# Patient Record
Sex: Female | Born: 1970 | Race: Black or African American | Hispanic: No | Marital: Married | Smoking: Never smoker
Health system: Southern US, Community
[De-identification: ages and names within clinical notes are randomized; demographics above are authoritative.]

## PROBLEM LIST (undated history)

## (undated) DIAGNOSIS — R87629 Unspecified abnormal cytological findings in specimens from vagina: Secondary | ICD-10-CM

## (undated) DIAGNOSIS — I10 Essential (primary) hypertension: Secondary | ICD-10-CM

## (undated) DIAGNOSIS — Z9884 Bariatric surgery status: Secondary | ICD-10-CM

## (undated) DIAGNOSIS — R0902 Hypoxemia: Secondary | ICD-10-CM

## (undated) DIAGNOSIS — F32A Depression, unspecified: Secondary | ICD-10-CM

## (undated) DIAGNOSIS — E079 Disorder of thyroid, unspecified: Secondary | ICD-10-CM

## (undated) DIAGNOSIS — G4730 Sleep apnea, unspecified: Secondary | ICD-10-CM

## (undated) DIAGNOSIS — T7840XA Allergy, unspecified, initial encounter: Secondary | ICD-10-CM

## (undated) DIAGNOSIS — F419 Anxiety disorder, unspecified: Secondary | ICD-10-CM

## (undated) DIAGNOSIS — E039 Hypothyroidism, unspecified: Secondary | ICD-10-CM

## (undated) DIAGNOSIS — C539 Malignant neoplasm of cervix uteri, unspecified: Secondary | ICD-10-CM

## (undated) HISTORY — DX: Sleep apnea, unspecified: G47.30

## (undated) HISTORY — DX: Depression, unspecified: F32.A

## (undated) HISTORY — DX: Anxiety disorder, unspecified: F41.9

## (undated) HISTORY — PX: CARPAL TUNNEL RELEASE: SHX101

## (undated) HISTORY — DX: Allergy, unspecified, initial encounter: T78.40XA

## (undated) HISTORY — DX: Hypoxemia: R09.02

## (undated) HISTORY — PX: ABDOMINAL HYSTERECTOMY: SHX81

## (undated) HISTORY — DX: Unspecified abnormal cytological findings in specimens from vagina: R87.629

---

## 2000-05-18 DIAGNOSIS — C539 Malignant neoplasm of cervix uteri, unspecified: Secondary | ICD-10-CM

## 2000-05-18 HISTORY — DX: Malignant neoplasm of cervix uteri, unspecified: C53.9

## 2013-05-18 DIAGNOSIS — Z9884 Bariatric surgery status: Secondary | ICD-10-CM

## 2013-05-18 DIAGNOSIS — Z6841 Body Mass Index (BMI) 40.0 and over, adult: Secondary | ICD-10-CM | POA: Insufficient documentation

## 2013-05-18 HISTORY — PX: ROUX-EN-Y PROCEDURE: SUR1287

## 2013-05-18 HISTORY — DX: Bariatric surgery status: Z98.84

## 2016-11-24 DIAGNOSIS — R609 Edema, unspecified: Secondary | ICD-10-CM | POA: Insufficient documentation

## 2016-11-24 DIAGNOSIS — M7741 Metatarsalgia, right foot: Secondary | ICD-10-CM | POA: Insufficient documentation

## 2016-11-24 DIAGNOSIS — M8430XA Stress fracture, unspecified site, initial encounter for fracture: Secondary | ICD-10-CM | POA: Insufficient documentation

## 2017-07-19 ENCOUNTER — Encounter (HOSPITAL_COMMUNITY): Payer: Self-pay | Admitting: Emergency Medicine

## 2017-07-19 ENCOUNTER — Other Ambulatory Visit: Payer: Self-pay

## 2017-07-19 ENCOUNTER — Emergency Department (HOSPITAL_COMMUNITY): Payer: Commercial Managed Care - PPO

## 2017-07-19 ENCOUNTER — Emergency Department (HOSPITAL_COMMUNITY)
Admission: EM | Admit: 2017-07-19 | Discharge: 2017-07-19 | Disposition: A | Discharge status: Home / Self Care | Payer: Commercial Managed Care - PPO | Attending: Emergency Medicine | Admitting: Emergency Medicine

## 2017-07-19 DIAGNOSIS — I10 Essential (primary) hypertension: Secondary | ICD-10-CM | POA: Insufficient documentation

## 2017-07-19 DIAGNOSIS — R0789 Other chest pain: Secondary | ICD-10-CM | POA: Diagnosis not present

## 2017-07-19 DIAGNOSIS — R079 Chest pain, unspecified: Secondary | ICD-10-CM | POA: Diagnosis present

## 2017-07-19 HISTORY — DX: Essential (primary) hypertension: I10

## 2017-07-19 HISTORY — DX: Disorder of thyroid, unspecified: E07.9

## 2017-07-19 HISTORY — DX: Malignant neoplasm of cervix uteri, unspecified: C53.9

## 2017-07-19 HISTORY — DX: Bariatric surgery status: Z98.84

## 2017-07-19 LAB — CBC WITH DIFFERENTIAL/PLATELET
Basophils Absolute: 0 10*3/uL (ref 0.0–0.1)
Basophils Relative: 1 %
Eosinophils Absolute: 0.1 10*3/uL (ref 0.0–0.7)
Eosinophils Relative: 2 %
HCT: 39.9 % (ref 36.0–46.0)
Hemoglobin: 12.3 g/dL (ref 12.0–15.0)
Lymphocytes Relative: 35 %
Lymphs Abs: 1.8 10*3/uL (ref 0.7–4.0)
MCH: 26 pg (ref 26.0–34.0)
MCHC: 30.8 g/dL (ref 30.0–36.0)
MCV: 84.4 fL (ref 78.0–100.0)
Monocytes Absolute: 0.5 10*3/uL (ref 0.1–1.0)
Monocytes Relative: 9 %
Neutro Abs: 2.9 10*3/uL (ref 1.7–7.7)
Neutrophils Relative %: 55 %
Platelets: 225 10*3/uL (ref 150–400)
RBC: 4.73 MIL/uL (ref 3.87–5.11)
RDW: 15.5 % (ref 11.5–15.5)
WBC: 5.2 10*3/uL (ref 4.0–10.5)

## 2017-07-19 LAB — COMPREHENSIVE METABOLIC PANEL
ALT: 20 U/L (ref 14–54)
AST: 24 U/L (ref 15–41)
Albumin: 4 g/dL (ref 3.5–5.0)
Alkaline Phosphatase: 60 U/L (ref 38–126)
Anion gap: 10 (ref 5–15)
BUN: 16 mg/dL (ref 6–20)
CO2: 29 mmol/L (ref 22–32)
Calcium: 9.6 mg/dL (ref 8.9–10.3)
Chloride: 100 mmol/L — ABNORMAL LOW (ref 101–111)
Creatinine, Ser: 0.78 mg/dL (ref 0.44–1.00)
GFR calc Af Amer: >60 mL/min (ref >60)
GFR calc non Af Amer: >60 mL/min (ref >60)
Glucose, Bld: 92 mg/dL (ref 65–99)
Potassium: 3.3 mmol/L — ABNORMAL LOW (ref 3.5–5.1)
Sodium: 139 mmol/L (ref 135–145)
Total Bilirubin: 0.1 mg/dL — ABNORMAL LOW (ref 0.3–1.2)
Total Protein: 7.2 g/dL (ref 6.5–8.1)

## 2017-07-19 LAB — TROPONIN I: Troponin I: <0.03 ng/mL (ref <0.03)

## 2017-07-19 MED ORDER — IBUPROFEN 800 MG PO TABS: 800.0000 mg | ORAL_TABLET | Freq: Three times a day (TID) | ORAL | 0 refills | Status: DC | PRN

## 2017-07-19 NOTE — ED Triage Notes (Signed)
PT c/o middle chest tightness intermittently that started this am with SOB on exertion. PT denies any pain at this time.

## 2017-07-19 NOTE — Discharge Instructions (Signed)
Follow-up with your family doctor next week if not improving.  No heavy lifting

## 2017-07-19 NOTE — ED Notes (Signed)
ekg handed to Dr. Butler 

## 2017-07-19 NOTE — ED Provider Notes (Signed)
[facility] Provider Note   CSN: [id number] Arrival date & time: 07/19/17  1416     History   Chief Complaint Chief Complaint  Patient presents with  . Chest Pain    HPI Jacqueline Parsons is a 47 y.o. female.  Patient states that she has been having some upper chest pain is worse with moving her neck.  She states that she recently did a lot of lifting because she was moving   The history is provided by the patient. No language interpreter was used.  Chest Pain   This is a new problem. The current episode started 2 days ago. The problem occurs constantly. The problem has not changed since onset.The pain is associated with movement. The pain is present in the substernal region. The pain is at a severity of 4/10. The pain is moderate. The quality of the pain is described as dull. Pertinent negatives include no abdominal pain, no back pain, no cough and no headaches.  Pertinent negatives for past medical history include no seizures.    Past Medical History:  Diagnosis Date  . Cervical cancer ([place]) 2002  . Gastric bypass status for obesity 2015  . Hypertension   . Thyroid disease     There are no active problems to display for this patient.   Past Surgical History:  Procedure Laterality Date  . ABDOMINAL HYSTERECTOMY    . CARPAL TUNNEL RELEASE      OB History    Gravida Para Term Preterm AB Living             0   SAB TAB Ectopic Multiple Live Births                   Home Medications    Prior to Admission medications   Medication Sig Start Date End Date Taking? Authorizing Provider  ibuprofen (ADVIL,MOTRIN) 800 MG tablet Take 1 tablet (800 mg total) by mouth every 8 (eight) hours as needed for moderate pain. 07/19/17   Milton Ferguson, MD    Family History History reviewed. No pertinent family history.  Social History Social History   Tobacco Use  . Smoking status: Never Smoker  . Smokeless tobacco: Never Used  Substance Use Topics  .  Alcohol use: Yes    Frequency: Never    Comment: occ  . Drug use: No     Allergies   Patient has no known allergies.   Review of Systems Review of Systems  Constitutional: Negative for appetite change and fatigue.  HENT: Negative for congestion, ear discharge and sinus pressure.   Eyes: Negative for discharge.  Respiratory: Negative for cough.   Cardiovascular: Positive for chest pain.  Gastrointestinal: Negative for abdominal pain and diarrhea.  Genitourinary: Negative for frequency and hematuria.  Musculoskeletal: Negative for back pain.  Skin: Negative for rash.  Neurological: Negative for seizures and headaches.  Psychiatric/Behavioral: Negative for hallucinations.     Physical Exam Updated Vital Signs BP 134/74 (BP Location: Left Arm)   Pulse 67   Temp 98.1 F (36.7 C) (Oral)   Resp 18   Ht 5\' 4"  (1.626 m)   Wt (!) 138.8 kg (306 lb)   SpO2 100%   BMI 52.52 kg/m   Physical Exam  Constitutional: She is oriented to person, place, and time. She appears well-developed.  HENT:  Head: Normocephalic.  Eyes: Conjunctivae and EOM are normal. No scleral icterus.  Neck: Neck supple. No thyromegaly present.  Cardiovascular: Normal rate and regular  rhythm. Exam reveals no gallop and no friction rub.  No murmur heard. Tender mid upper chest  Pulmonary/Chest: No stridor. She has no wheezes. She has no rales. She exhibits no tenderness.  Abdominal: She exhibits no distension. There is no tenderness. There is no rebound.  Musculoskeletal: Normal range of motion. She exhibits no edema.  Lymphadenopathy:    She has no cervical adenopathy.  Neurological: She is oriented to person, place, and time. She exhibits normal muscle tone. Coordination normal.  Skin: No rash noted. No erythema.  Psychiatric: She has a normal mood and affect. Her behavior is normal.     ED Treatments / Results  Labs (all labs ordered are listed, but only abnormal results are displayed) Labs  Reviewed  COMPREHENSIVE METABOLIC PANEL - Abnormal; Notable for the following components:      Result Value   Potassium 3.3 (*)    Chloride 100 (*)    Total Bilirubin 0.1 (*)    All other components within normal limits  CBC WITH DIFFERENTIAL/PLATELET  TROPONIN I    EKG  EKG Interpretation  Date/Time:  Monday July 19 2017 14:27:11 EST Ventricular Rate:  68 PR Interval:  168 QRS Duration: 92 QT Interval:  404 QTC Calculation: 429 R Axis:   -6 Text Interpretation:  Normal sinus rhythm Possible Left atrial enlargement Left ventricular hypertrophy Nonspecific T wave abnormality Abnormal ECG Confirmed by Milton Ferguson [phone]) on 07/19/2017 5:35:58 PM       Radiology Dg Chest 2 View  Result Date: 07/19/2017 CLINICAL DATA:  Onset of mid chest pain this morning. Nonsmoker. History of bariatric surgery, history of cervical cancer with hysterectomy. EXAM: CHEST  2 VIEW COMPARISON:  None in PACs FINDINGS: The lungs are well-expanded and clear. The heart and pulmonary vascularity are normal. The mediastinum is normal in width. There is deviation of the trachea toward the left at the base of the neck. There is no pleural effusion. There is mild multilevel degenerative disc disease of the thoracic spine. There is mild widening of the right AC joint. IMPRESSION: There is no pneumonia, CHF, nor other acute cardiopulmonary abnormality. Deviation of the trachea toward the left may reflect a goiter. Clinical examination and possibly thyroid ultrasound would be useful. Multilevel degenerative disc disease of the thoracic spine. Electronically Signed   By: David  Martinique M.D.   On: 07/19/2017 14:52    Procedures Procedures (including critical care time)  Medications Ordered in ED Medications - No data to display   Initial Impression / Assessment and Plan / ED Course  I have reviewed the triage vital signs and the nursing notes.  Pertinent labs & imaging results that were available during my care  of the patient were reviewed by me and considered in my medical decision making (see chart for details).     Labs unremarkable including troponin.  EKG shows no acute changes chest x-ray is consistent with large thyroid.  She does get treated for thyroid disease.  Suspect chest pain is musculoskeletal she will be given Motrin and will follow-up as needed  Final Clinical Impressions(s) / ED Diagnoses   Final diagnoses:  Atypical chest pain  Chest wall pain    ED Discharge Orders        Ordered    ibuprofen (ADVIL,MOTRIN) 800 MG tablet  Every 8 hours PRN     07/19/17 1747       Milton Ferguson, MD 07/19/17 1750

## 2017-08-20 ENCOUNTER — Encounter: Payer: Self-pay | Admitting: Endocrinology

## 2017-09-13 ENCOUNTER — Ambulatory Visit (INDEPENDENT_AMBULATORY_CARE_PROVIDER_SITE_OTHER): Payer: Commercial Managed Care - PPO | Admitting: Podiatry

## 2017-09-13 ENCOUNTER — Encounter: Payer: Self-pay | Admitting: Podiatry

## 2017-09-13 ENCOUNTER — Ambulatory Visit (INDEPENDENT_AMBULATORY_CARE_PROVIDER_SITE_OTHER): Payer: Commercial Managed Care - PPO

## 2017-09-13 DIAGNOSIS — M779 Enthesopathy, unspecified: Secondary | ICD-10-CM

## 2017-09-13 DIAGNOSIS — S99921A Unspecified injury of right foot, initial encounter: Secondary | ICD-10-CM | POA: Diagnosis not present

## 2017-09-13 DIAGNOSIS — M778 Other enthesopathies, not elsewhere classified: Secondary | ICD-10-CM

## 2017-09-13 MED ORDER — MELOXICAM 15 MG PO TABS: 15.0000 mg | ORAL_TABLET | Freq: Every day | ORAL | 1 refills | Status: AC

## 2017-09-14 NOTE — Progress Notes (Signed)
   HPI: 47 year old female with PMHx of T2DM presenting today as a new patient with a chief complaint of pain to the dorsal aspect of the right foot that began about 3.5 years ago after dropping a three pound weight on the foot. She reports associated swelling. She states she has been evaluated for this in the past with X-Rays and an MRI. She has worn a CAM boot and compression socks and has gone through physical therapy as well. Patient is here for further evaluation and treatment.   Past Medical History:  Diagnosis Date  . Cervical cancer ([place]) 2002  . Gastric bypass status for obesity 2015  . Hypertension   . Thyroid disease      Physical Exam: General: The patient is alert and oriented x3 in no acute distress.  Dermatology: Skin is warm, dry and supple bilateral lower extremities. Negative for open lesions or macerations.  Vascular: Palpable pedal pulses bilaterally. No edema or erythema noted. Capillary refill within normal limits.  Neurological: Epicritic and protective threshold grossly intact bilaterally.   Musculoskeletal Exam: Pain with palpation to the dorsal right midfoot. Range of motion within normal limits to all pedal and ankle joints bilateral. Muscle strength 5/5 in all groups bilateral.   Radiographic Exam:  Normal osseous mineralization. Joint spaces preserved. No fracture/dislocation/boney destruction.    Assessment: 1. Right midfoot capsulitis    Plan of Care:  1. Patient evaluated. X-Rays reviewed.  2. Injection of 0.5 mLs Celestone Soluspan injected into the dorsal right midfoot.  3. Prescription for Meloxicam provided to patient.  4. Recommended good shoe gear.  5. Return to clinic in 4 weeks.       Edrick Kins, DPM [facility]  Dr. Edrick Kins, DPM    [address], [place] [address]                Office [phone]  Fax [phone]

## 2017-10-13 ENCOUNTER — Ambulatory Visit (INDEPENDENT_AMBULATORY_CARE_PROVIDER_SITE_OTHER): Payer: Commercial Managed Care - PPO | Admitting: Podiatry

## 2017-10-13 ENCOUNTER — Encounter: Payer: Self-pay | Admitting: Endocrinology

## 2017-10-13 ENCOUNTER — Ambulatory Visit (INDEPENDENT_AMBULATORY_CARE_PROVIDER_SITE_OTHER): Payer: Commercial Managed Care - PPO | Admitting: Endocrinology

## 2017-10-13 ENCOUNTER — Encounter

## 2017-10-13 ENCOUNTER — Telehealth: Payer: Self-pay | Admitting: Endocrinology

## 2017-10-13 VITALS — BP 128/80 | HR 81 | Ht 64.0 in | Wt 305.6 lb

## 2017-10-13 DIAGNOSIS — M779 Enthesopathy, unspecified: Secondary | ICD-10-CM | POA: Diagnosis not present

## 2017-10-13 DIAGNOSIS — R600 Localized edema: Secondary | ICD-10-CM | POA: Diagnosis not present

## 2017-10-13 DIAGNOSIS — E041 Nontoxic single thyroid nodule: Secondary | ICD-10-CM | POA: Diagnosis not present

## 2017-10-13 DIAGNOSIS — M778 Other enthesopathies, not elsewhere classified: Secondary | ICD-10-CM

## 2017-10-13 DIAGNOSIS — E039 Hypothyroidism, unspecified: Secondary | ICD-10-CM | POA: Diagnosis not present

## 2017-10-13 MED ORDER — METHYLPREDNISOLONE 4 MG PO TBPK: ORAL_TABLET | ORAL | 0 refills | Status: DC

## 2017-10-13 NOTE — Telephone Encounter (Signed)
[facility] is calling in regards to a thyroid biopsy order that was put in for patient They stated they can not schedule this because patient has not have an ultrasound.  Please advise  [phone]

## 2017-10-13 NOTE — Progress Notes (Addendum)
Patient ID: Jacqueline Parsons, female   DOB: [birth year], 47 y.o.   MRN: [id number]             Reason for Appointment: Evaluation of thyroid nodule    History of Present Illness:   The patient is being referred by his primary care provider Junius Roads, PA-C.  The patient's thyroid nodule was first discovered on an exam in 07/2017 as a follow-up to an ER visit She had gone to the emergency room because of upper mid chest discomfort for 3 or 4 days Also was having a sensation of difficulty swallowing for a few days also but not painful swallowing She was told to have chest wall pain and told to follow-up with her PCP  Since her PCP found a thyroid enlargement on exam and ultrasound was done  The thyroid ultrasound on 08/20/2017 showed the following 5.1 cm dominant right superior nodule, solid and isoechoic Also has a 0.8 cm nodule on the right side with total right lower dimensions of 8.8 cm and normal left lobe size  She has been referred here for further management  HYPOTHYROIDISM:  She was told at the age of 63 that she had hypothyroidism She had symptoms of fatigue and irregular heavy bleeding with her menstrual cycles She thinks her symptoms started getting better with thyroid hormone supplements which she has continued  For several years she had been on 75 mcg of levothyroxine but in March she was told to increase the dose to 88 mcg based on her labs At that time she was not complaining of any unusual fatigue With changing her dose she has not felt any different and no change in her energy level also  She is very regular with taking her levothyroxine with water before breakfast and is taking her vitamins in the afternoon  She has not gone back for follow-up after the dosage change in March  No results found for: FREET4, TSH  Allergies as of 10/13/2017   No Known Allergies     Medication List        Accurate as of 10/13/17  4:13 PM. Always use your most recent med  list.          amLODipine 5 MG tablet Commonly known as:  NORVASC   hydrochlorothiazide 12.5 MG tablet Commonly known as:  HYDRODIURIL Take by mouth.   levothyroxine 88 MCG tablet Commonly known as:  SYNTHROID, LEVOTHROID Take 88 mcg by mouth daily.   lisinopril 20 MG tablet Commonly known as:  PRINIVIL,ZESTRIL   Magnesium Oxide 500 MG Tabs Take by mouth.   meloxicam 15 MG tablet Commonly known as:  MOBIC Take 1 tablet (15 mg total) by mouth daily.   methylPREDNISolone 4 MG Tbpk tablet Commonly known as:  MEDROL DOSEPAK 6 day dose pack - take as directed   vitamin B-12 500 MCG tablet Commonly known as:  CYANOCOBALAMIN Take by mouth.   Vitamin D3 5000 units Tabs Take by mouth.       Allergies: No Known Allergies  Past Medical History:  Diagnosis Date  . Cervical cancer ([place]) 2002  . Gastric bypass status for obesity 2015  . Hypertension   . Thyroid disease     There is no history of radiation to the neck in childhood  Past Surgical History:  Procedure Laterality Date  . ABDOMINAL HYSTERECTOMY    . CARPAL TUNNEL RELEASE      History reviewed. No pertinent family history.  Social History:  reports that she has  never smoked. She has never used smokeless tobacco. She reports that she drinks alcohol. She reports that she does not use drugs.     Review of Systems  Constitutional: Negative for weight loss.  HENT: Positive for trouble swallowing.   Respiratory: Negative for shortness of breath.   Cardiovascular: Negative for leg swelling.  Gastrointestinal: Negative for constipation.  Endocrine: Positive for cold intolerance.       No menstrual cycles secondary to hysterectomy Only mild cold intolerance present   Musculoskeletal:       Has had some pain in her foot  Neurological: Negative for weakness.      Examination:   BP 128/80 (BP Location: Left Arm, Patient Position: Sitting, Cuff Size: Large)   Pulse 81   Ht 5\' 4"  (1.626 m)   Wt (!)  305 lb 9.6 oz (138.6 kg)   SpO2 98%   BMI 52.46 kg/m    General Appearance:  well-looking, has marked generalized obesity        Eyes: No abnormal prominence or swelling of the eyes         THYROID: Thyroid nodule is palpable on the right and is smooth and fleshy, the right lobe is involved by the nodule fairly completely and measures about 3-3.5 times normal. Left side is not palpable The neck circumference is 41 cm There is no stridor   There is no lymphadenopathy in the neck  Heart sounds normal Lungs clear Abdomen shows no hepatosplenomegaly or other mass.    Reflexes at biceps are normal.  Skin: No rash or lesions Extremities: No edema  Assessment/Plan:  Thyroid nodule:  She has a large 5 cm nodule which was recently diagnosed Not clear if this is causing dysphagia which is relatively mild now The texture of the nodule likely indicates this may be a colloid nodule Also her nodule has low index of suspicion for malignancy because of being isoechoic and no other suspicious characteristics  However because of the size will need a needle aspiration for diagnosis  Discussed the procedure for doing the needle aspiration, expected results and that usually 95% of nodules are benign Currently the patient feels that even though she is having some mild dysphagia she would not want to have surgery to remove the nodule because of her symptomatology at this time   HYPOTHYROIDISM:  She appears to have had long-standing hypothyroidism but appears to be mild with her requiring relatively small doses of levothyroxine for quite some time No recent labs available She had a dosage change done in March based on thyroid levels and has not had a follow-up Patient would like to have her follow-up thyroid labs done today   Consultation note sent to the referring physician  Elayne Snare 10/13/2017  Addendum: Biopsy showed benign follicular nodule   Elayne Snare 11/10/17

## 2017-10-13 NOTE — Telephone Encounter (Signed)
FYI

## 2017-10-13 NOTE — Telephone Encounter (Signed)
They will have to get the films/information from the [place] [facility] at [facility].  Unless they can do with the copy of the ultrasound report we have

## 2017-10-14 ENCOUNTER — Telehealth: Payer: Self-pay | Admitting: *Deleted

## 2017-10-14 ENCOUNTER — Other Ambulatory Visit: Payer: Self-pay | Admitting: Endocrinology

## 2017-10-14 DIAGNOSIS — E041 Nontoxic single thyroid nodule: Secondary | ICD-10-CM

## 2017-10-14 DIAGNOSIS — R600 Localized edema: Secondary | ICD-10-CM

## 2017-10-14 LAB — T4, FREE: Free T4: 0.85 ng/dL (ref 0.60–1.60)

## 2017-10-14 LAB — TSH: TSH: 2.24 u[IU]/mL (ref 0.35–4.50)

## 2017-10-14 NOTE — Telephone Encounter (Signed)
New order for biopsy has been placed according to specifications from radiology

## 2017-10-14 NOTE — Telephone Encounter (Signed)
[facility] stated that they would like a copy of the report and requested that it be faxed to [phone] and sent to the attention of [facility]. Deatra Canter stated that the referral order place in Epic by Dr. Dwyane Dee must stated in the referral where the biopsy needs to be done. It must state whether it is anterior/posterior, superior/inferior, etc.

## 2017-10-14 NOTE — Telephone Encounter (Signed)
-----   Message from Edrick Kins, DPM sent at 10/13/2017  3:02 PM EDT ----- Regarding: Referral vascular Please refer to vascular. Thanks, Dr. Amalia Hailey  Dx: RLE edema

## 2017-10-15 NOTE — Progress Notes (Signed)
Please call to let patient know that the thyroid results are normal and no change in dosage needed

## 2017-10-17 NOTE — Progress Notes (Signed)
   HPI: 47 year old female with PMHx of T2DM presenting today for follow up evaluation of midfoot capsulitis of the right foot. She states the pain is no better. She denies worsening pain. She reports swelling of the RLE. Patient is here for further evaluation and treatment.   Past Medical History:  Diagnosis Date  . Cervical cancer ([place]) 2002  . Gastric bypass status for obesity 2015  . Hypertension   . Thyroid disease      Physical Exam: General: The patient is alert and oriented x3 in no acute distress.  Dermatology: Skin is warm, dry and supple bilateral lower extremities. Negative for open lesions or macerations.  Vascular: Palpable pedal pulses bilaterally. Edema noted to the RLE. Capillary refill within normal limits.  Neurological: Epicritic and protective threshold grossly intact bilaterally.   Musculoskeletal Exam: Pain with palpation to the dorsal right midfoot. Range of motion within normal limits to all pedal and ankle joints bilateral. Muscle strength 5/5 in all groups bilateral.    Assessment: 1. Right midfoot capsulitis  2. RLE edema    Plan of Care:  1. Patient evaluated. 2. Appointment with Liliane Channel for custom molded orthotics.  3. Referral to Vascular for RLE edema.  4. Prescription for Medrol Dose Pak provided to patient. Resume taking Meloxicam once completed.  5. Continue wearing compression hose daily.  6. Return to clinic in 8 weeks.       Edrick Kins, DPM [facility]  Dr. Edrick Kins, DPM    [address], [place] [address]                Office [phone]  Fax [phone]

## 2017-10-18 ENCOUNTER — Ambulatory Visit (INDEPENDENT_AMBULATORY_CARE_PROVIDER_SITE_OTHER): Payer: Commercial Managed Care - PPO | Admitting: Orthotics

## 2017-10-18 DIAGNOSIS — M779 Enthesopathy, unspecified: Secondary | ICD-10-CM

## 2017-10-18 DIAGNOSIS — R600 Localized edema: Secondary | ICD-10-CM

## 2017-10-18 DIAGNOSIS — S99921A Unspecified injury of right foot, initial encounter: Secondary | ICD-10-CM

## 2017-10-18 NOTE — Progress Notes (Signed)
Patient came into today to be cast for Custom Foot Orthotics. Upon recommendation of Dr. Amalia Hailey Patient presents with edma, midfoot pain due to accident 4 years ago  Goals are supporting the arch, cushioing.  Plan vendor [employer]

## 2017-10-19 NOTE — Telephone Encounter (Signed)
Faxed addendum to referral 10/13/2017 clinicals.

## 2017-10-29 ENCOUNTER — Ambulatory Visit (INDEPENDENT_AMBULATORY_CARE_PROVIDER_SITE_OTHER): Payer: Commercial Managed Care - PPO | Admitting: Cardiovascular Disease

## 2017-10-29 ENCOUNTER — Encounter: Payer: Self-pay | Admitting: Cardiovascular Disease

## 2017-10-29 VITALS — BP 146/97 | HR 79 | Ht 64.0 in | Wt 306.0 lb

## 2017-10-29 DIAGNOSIS — R600 Localized edema: Secondary | ICD-10-CM | POA: Diagnosis not present

## 2017-10-29 DIAGNOSIS — E785 Hyperlipidemia, unspecified: Secondary | ICD-10-CM | POA: Insufficient documentation

## 2017-10-29 DIAGNOSIS — M7989 Other specified soft tissue disorders: Secondary | ICD-10-CM

## 2017-10-29 DIAGNOSIS — E7800 Pure hypercholesterolemia, unspecified: Secondary | ICD-10-CM | POA: Diagnosis not present

## 2017-10-29 DIAGNOSIS — I10 Essential (primary) hypertension: Secondary | ICD-10-CM

## 2017-10-29 NOTE — Progress Notes (Signed)
10/29/2017 [name]   [birth year]  [id number]  Primary Physician Myrtha Mantis Rebeca Alert, PA-C Primary Cardiologist: Lorretta Harp MD Garret Reddish, [place], [place]  HPI:  Jacqueline Parsons is a 47 y.o. severely overweight single African-American American female with no children referred by Dr. Amalia Hailey for peripheral vascular evaluation because of chronic right lower extremity edema.  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  She has no cardiac risk factors other than treated hypertension and hyperlipidemia.  She did have cervical cancer with surgical resection back in 2002 without recurrence.  She is never had a heart attack or stroke.  She denies chest pain or shortness of breath or claudication.  She apparently dropped a 5 pound weight on her right foot 4 years ago and is had chronic pain and swelling since that time.  She seen Dr. Amalia Hailey for foot pain who referred her here for further evaluation.   Current Meds  Medication Sig  . amLODipine (NORVASC) 5 MG tablet   . Cholecalciferol (VITAMIN D3) 5000 units TABS Take by mouth.  . hydrochlorothiazide (HYDRODIURIL) 12.5 MG tablet Take by mouth.  . levothyroxine (SYNTHROID, LEVOTHROID) 88 MCG tablet Take 88 mcg by mouth daily.  Marland Kitchen lisinopril (PRINIVIL,ZESTRIL) 20 MG tablet   . Magnesium Oxide 500 MG TABS Take by mouth.  . pravastatin (PRAVACHOL) 40 MG tablet Take 40 mg by mouth daily.  . vitamin B-12 (CYANOCOBALAMIN) 500 MCG tablet Take by mouth.  . [DISCONTINUED] methylPREDNISolone (MEDROL DOSEPAK) 4 MG TBPK tablet 6 day dose pack - take as directed     No Known Allergies  Social History   Socioeconomic History  . Marital status: Single    Spouse name: Not on file  . Number of children: Not on file  . Years of education: Not on file  . Highest education level: Not on file  Occupational History  . Not on file  Social Needs  . Financial resource strain: Not on file  . Food insecurity:    Worry: Not on  file    Inability: Not on file  . Transportation needs:    Medical: Not on file    Non-medical: Not on file  Tobacco Use  . Smoking status: Never Smoker  . Smokeless tobacco: Never Used  Substance and Sexual Activity  . Alcohol use: Yes    Frequency: Never    Comment: occ  . Drug use: No  . Sexual activity: Not on file  Lifestyle  . Physical activity:    Days per week: Not on file    Minutes per session: Not on file  . Stress: Not on file  Relationships  . Social connections:    Talks on phone: Not on file    Gets together: Not on file    Attends religious service: Not on file    Active member of club or organization: Not on file    Attends meetings of clubs or organizations: Not on file    Relationship status: Not on file  . Intimate partner violence:    Fear of current or ex partner: Not on file    Emotionally abused: Not on file    Physically abused: Not on file    Forced sexual activity: Not on file  Other Topics Concern  . Not on file  Social History Narrative  . Not on file     Review of Systems: General: negative for chills, fever, night sweats or weight changes.  Cardiovascular: negative for chest  pain, dyspnea on exertion, edema, orthopnea, palpitations, paroxysmal nocturnal dyspnea or shortness of breath Dermatological: negative for rash Respiratory: negative for cough or wheezing Urologic: negative for hematuria Abdominal: negative for nausea, vomiting, diarrhea, bright red blood per rectum, melena, or hematemesis Neurologic: negative for visual changes, syncope, or dizziness All other systems reviewed and are otherwise negative except as noted above.    Blood pressure (!) 146/97, pulse 79, height 5\' 4"  (1.626 m), weight (!) 306 lb (138.8 kg).  General appearance: alert and no distress Neck: no adenopathy, no carotid bruit, no JVD, supple, symmetrical, trachea midline and thyroid not enlarged, symmetric, no tenderness/mass/nodules Lungs: clear to  auscultation bilaterally Heart: regular rate and rhythm, S1, S2 normal, no murmur, click, rub or gallop Extremities: 1+ edema right lower extremity Pulses: 2+ and symmetric Skin: Skin color, texture, turgor normal. No rashes or lesions Neurologic: Alert and oriented X 3, normal strength and tone. Normal symmetric reflexes. Normal coordination and gait  EKG not performed today  ASSESSMENT AND PLAN:   Hyperlipidemia History of hyperlipidemia on pravastatin  Essential hypertension History of essential hypertension her blood pressure measured today at 146/97.  She is on lisinopril or thiazide as well as amlodipine.  Continue current meds at current dosing.  Swelling of right lower extremity [name] was referred to me by Dr. Amalia Hailey for chronic right lower extremity swelling.  She apparently dropped a 5 pound weight on her right foot 4 years ago and has been painful and swollen since.  There is no prior evidence of DVT.  There is some asymmetry in her lower extremities.  I am going to get lower extremity venous Doppler studies to further evaluate and rule out chronic DVT.      Lorretta Harp MD FACP,FACC,FAHA, [facility] 10/29/2017 10:20 AM

## 2017-10-29 NOTE — Assessment & Plan Note (Signed)
History of hyperlipidemia on pravastatin 

## 2017-10-29 NOTE — Patient Instructions (Signed)
Medication Instructions: Your physician recommends that you continue on your current medications as directed. Please refer to the Current Medication list given to you today.   Testing/Procedures: Your physician has requested that you have a lower extremity venous duplex. This test is an ultrasound of the veins in the legs or arms. It looks at venous blood flow that carries blood from the heart to the legs or arms. Allow one hour for a Lower Venous exam. Allow thirty minutes for an Upper Venous exam. There are no restrictions or special instructions.   Follow-Up: Your physician recommends that you schedule a follow-up appointment as needed with Dr. Gwenlyn Found.

## 2017-10-29 NOTE — Assessment & Plan Note (Signed)
History of essential hypertension her blood pressure measured today at 146/97.  She is on lisinopril or thiazide as well as amlodipine.  Continue current meds at current dosing.

## 2017-10-29 NOTE — Assessment & Plan Note (Signed)
Ms. Jacqueline Parsons was referred to me by Dr. Amalia Hailey for chronic right lower extremity swelling.  She apparently dropped a 5 pound weight on her right foot 4 years ago and has been painful and swollen since.  There is no prior evidence of DVT.  There is some asymmetry in her lower extremities.  I am going to get lower extremity venous Doppler studies to further evaluate and rule out chronic DVT.

## 2017-11-04 ENCOUNTER — Other Ambulatory Visit (HOSPITAL_COMMUNITY)
Admission: RE | Admit: 2017-11-04 | Discharge: 2017-11-04 | Disposition: A | Discharge status: Home / Self Care | Payer: Commercial Managed Care - PPO | Source: Ambulatory Visit | Attending: Radiology | Admitting: Radiology

## 2017-11-04 ENCOUNTER — Ambulatory Visit (HOSPITAL_COMMUNITY)
Admission: RE | Admit: 2017-11-04 | Discharge: 2017-11-04 | Disposition: A | Discharge status: Home / Self Care | Payer: Commercial Managed Care - PPO | Source: Ambulatory Visit | Attending: Cardiology | Admitting: Cardiology

## 2017-11-04 ENCOUNTER — Ambulatory Visit
Admission: RE | Admit: 2017-11-04 | Discharge: 2017-11-04 | Disposition: A | Discharge status: Home / Self Care | Payer: Commercial Managed Care - PPO | Source: Ambulatory Visit | Attending: Endocrinology | Admitting: Endocrinology

## 2017-11-04 DIAGNOSIS — R600 Localized edema: Secondary | ICD-10-CM | POA: Diagnosis present

## 2017-11-04 DIAGNOSIS — M79671 Pain in right foot: Secondary | ICD-10-CM | POA: Diagnosis not present

## 2017-11-04 DIAGNOSIS — E041 Nontoxic single thyroid nodule: Secondary | ICD-10-CM

## 2017-11-08 ENCOUNTER — Ambulatory Visit: Payer: Commercial Managed Care - PPO | Admitting: Orthotics

## 2017-11-08 DIAGNOSIS — M778 Other enthesopathies, not elsewhere classified: Secondary | ICD-10-CM

## 2017-11-08 DIAGNOSIS — M779 Enthesopathy, unspecified: Principal | ICD-10-CM

## 2017-11-08 NOTE — Progress Notes (Signed)
Patient came in today to pick up custom made foot orthotics.  The goals were accomplished and the patient reported no dissatisfaction with said orthotics.  Patient was advised of breakin period and how to report any issues. 

## 2017-11-10 NOTE — Progress Notes (Signed)
Please call to let patient know that the biopsy results are negative and no further action needed

## 2017-12-08 ENCOUNTER — Ambulatory Visit: Payer: Commercial Managed Care - PPO | Admitting: Podiatry

## 2018-11-14 ENCOUNTER — Encounter: Payer: Self-pay | Admitting: Gastroenterology

## 2018-12-21 ENCOUNTER — Encounter: Payer: Self-pay | Admitting: Adult Health

## 2019-01-04 ENCOUNTER — Other Ambulatory Visit: Payer: Self-pay

## 2019-01-04 ENCOUNTER — Other Ambulatory Visit: Payer: Commercial Managed Care - PPO | Admitting: Adult Health

## 2019-01-04 ENCOUNTER — Encounter: Payer: Self-pay | Admitting: Gastroenterology

## 2019-01-04 ENCOUNTER — Ambulatory Visit (INDEPENDENT_AMBULATORY_CARE_PROVIDER_SITE_OTHER): Payer: Commercial Managed Care - PPO | Admitting: Gastroenterology

## 2019-01-04 DIAGNOSIS — R1310 Dysphagia, unspecified: Secondary | ICD-10-CM

## 2019-01-04 NOTE — Progress Notes (Signed)
Subjective:    Patient ID: Jacqueline Parsons, female    DOB: 11-21-1970, 48 y.o.   MRN: [id number]  [employer], MD  HPI TROUBLE SWALLOWING FOR 6 MOS. HAS THYROID NODULES. DEFINITELY WITH SOLIDS AND LARGE PILLS MAY GET LODGED. BMs:1-2X/WEEK. SOMETIMES FEELS CONSTIPATED AND KEEPS MG CITRATE AND ENEMAS PRN CONSTIPATION. TRIES TO EAT FIBER BUT MAYBE NOT [place]. DRINKING MORE WATER.  PT DENIES FEVER, CHILLS, HEMATOCHEZIA, HEMATEMESIS, nausea, vomiting, melena, diarrhea, CHEST PAIN, SHORTNESS OF BREATH, CHANGE IN BOWEL IN HABITS, abdominal pain, problems with sedation, OR heartburn or indigestion.   Past Medical History:  Diagnosis Date  . Cervical cancer ([place]) 2002  . Gastric bypass status for obesity 2015  . Hypertension   . Thyroid disease     Past Surgical History:  Procedure Laterality Date  . ABDOMINAL HYSTERECTOMY    . CARPAL TUNNEL RELEASE      No Known Allergies  Current Outpatient Medications  Medication Sig    . calcium gluconate 500 MG tablet Take 1 tablet by mouth daily.    . Cholecalciferol (VITAMIN D3) 5000 units TABS Take by mouth daily.     Marland Kitchen diltiazem (DILACOR XR) 240 MG 24 hr capsule Take 240 mg by mouth daily.    Marland Kitchen SYNTHROID, LEVOTHROID) 88 MCG tablet Take 88 mcg by mouth daily.    Marland Kitchen lisinopril (PRINIVIL,ZESTRIL) 20 MG tablet Take 20 mg by mouth daily.     . pediatric multivitamin-iron 15 MG chewable tablet Chew 1 tablet by mouth daily.    . pravastatin (PRAVACHOL) 40 MG tablet Take 40 mg by mouth daily.    . valACYclovir (VALTREX) 500 MG tablet as needed.    Marland Kitchen amLODipine (NORVASC) 5 MG tablet     . hydrochlorothiazide 12.5 MG tablet Take by mouth.    . Magnesium Oxide 500 MG TABS Take by mouth.    . vitamin B-12  500 MCG tablet Take by mouth.     Family History  Problem Relation Age of Onset  . Thyroid disease Neg Hx   . Colon cancer Neg Hx   . Colon polyps Neg Hx    Social History   Socioeconomic History  . Marital status: Single    Spouse  name: Not on file  . Number of children: Not on file  . Years of education: Not on file  . Highest education level: Not on file  Occupational History  . Not on file  Social Needs  . Financial resource strain: Not on file  . Food insecurity    Worry: Not on file    Inability: Not on file  . Transportation needs    Medical: Not on file    Non-medical: Not on file  Tobacco Use  . Smoking status: Never Smoker  . Smokeless tobacco: Never Used  Substance and Sexual Activity  . Alcohol use: Yes    Frequency: Never    Comment: occ  . Drug use: No  . Sexual activity: Not on file  Lifestyle  . Physical activity    Days per week: Not on file    Minutes per session: Not on file  . Stress: Not on file  Relationships  . Social Herbalist on phone: Not on file    Gets together: Not on file    Attends religious service: Not on file    Active member of club or organization: Not on file    Attends meetings of clubs or organizations: Not on file  Relationship status: Not on file  Other Topics Concern  . Not on file  Social History Narrative   SINGLE-ENGAGED. NO KIDS. WORKS AS A TAX COLLECTOR. SPENDS FREE TIME: CRAFTING.    Review of Systems PER HPI OTHERWISE ALL SYSTEMS ARE NEGATIVE.    Objective:   Physical Exam Vitals signs reviewed.  Constitutional:      General: She is not in acute distress.    Appearance: She is well-developed.  HENT:     Head: Normocephalic and atraumatic.     Mouth/Throat:     Pharynx: No oropharyngeal exudate.  Eyes:     General: No scleral icterus.    Pupils: Pupils are equal, round, and reactive to light.  Neck:     Musculoskeletal: Normal range of motion and neck supple.  Cardiovascular:     Rate and Rhythm: Normal rate and regular rhythm.     Heart sounds: Normal heart sounds.  Pulmonary:     Effort: Pulmonary effort is normal. No respiratory distress.     Breath sounds: Normal breath sounds.  Abdominal:     General: Bowel sounds  are normal. There is no distension.     Palpations: Abdomen is soft.     Tenderness: There is no abdominal tenderness.  Musculoskeletal:     Right lower leg: Edema (trace) present.     Left lower leg: Edema (trace) present.  Lymphadenopathy:     Cervical: No cervical adenopathy.  Skin:    General: Skin is warm.     Findings: No rash.  Neurological:     General: No focal deficit present.     Mental Status: She is alert and oriented to person, place, and time.  Psychiatric:        Mood and Affect: Mood normal.        Thought Content: Thought content normal.     Comments: NORMAL AFFECT       Assessment & Plan:

## 2019-01-04 NOTE — Progress Notes (Signed)
ON RECALL  °

## 2019-01-04 NOTE — Patient Instructions (Addendum)
EAT TO LIVE AND THINK OF FOOD AS MEDICINE.  DRINK WATER TO KEEP YOUR URINE LIGHT YELLOW.  To have more energy and to lose weight:      1. CONTINUE YOUR WEIGHT LOSS EFFORTS. I RECOMMEND YOU READ AND FOLLOW RECOMMENDATIONS BY DR. MARK HYMAN, "10-DAY DETOX DIET". SET A DATE TO START IN 30 DAYS.    2. If you must eat bread, EAT EZEKIEL BREAD. IT IS IN THE FROZEN SECTION OF THE GROCERY STORE.    3. Do not drink SODA, GATORADE, ENERGY DRINKS, OR DIET SODA.     4. AVOID HIGH FRUCTOSE CORN SYRUP.     5. DO NOT chew SUGAR FREE GUM OR USE ARTIFICIAL SWEETENERS. IF NEEDED USE STEVIA AS A SWEETENER.    6. DO NOT EAT ENRICHED WHEAT FLOUR, PASTA, RICE, OR CEREAL.    7. DO NOT EAT FARM RAISED SEAFOOD. ONLY EAT WILD CAUGHT SEAFOOD, GRASS FED BEEF OR CHICKEN, OR EGGS FROM PASTURE RAISED CHICKENS.    8. PRACTICE CHAIR YOGA FOR 15-30 MINS 3 OR 4 TIMES A WEEK AND PROGRESS TO HATHA YOGA OVER NEXT 6 MOS.    9. CONTINUE MULTIVITAMIN, VITAMIN B12, AND VITAMIN D3 2000 IU DAILY.    COMPLETE BARIUM SWALLOW.  FOLLOW UP IN 6 MOS. WE WILL SCHEDULE YOUR COLONOSCOPY AFTER YOUR NEXT VISIT.

## 2019-01-04 NOTE — Progress Notes (Signed)
CC'ED TO PCP 

## 2019-01-04 NOTE — Assessment & Plan Note (Signed)
MOST LIKELY DUE TO ENLARGED THYROID. DIFFERENTIAL DIAGNOSIS INCLUDES: PEPTIC STRICTURE, PRIMARY ESOPHAGEAL MOTILITY DISORDER,OR 2o ESOPHAGEAL MOTILITY DISORDER DUE TO UNCONTROLLED REFLUX, LESS LIKELY ESOPHAGEAL CANCER.   COMPLETE BARIUM SWALLOW. PROCEDURE EXPLAINED. FOLLOW UP IN 6 MOS. WE WILL SCHEDULE YOUR COLONOSCOPY AFTER YOUR NEXT VISIT.

## 2019-01-04 NOTE — Assessment & Plan Note (Signed)
WEIGHT STABLE.   EAT TO LIVE AND THINK OF FOOD AS MEDICINE.  DRINK WATER TO KEEP YOUR URINE LIGHT YELLOW.  To have more energy and to lose weight:      1. CONTINUE YOUR WEIGHT LOSS EFFORTS. I RECOMMEND YOU READ AND FOLLOW RECOMMENDATIONS BY DR. MARK HYMAN, "10-DAY DETOX DIET".   2. If you must eat bread, EAT EZEKIEL BREAD. IT IS IN THE FROZEN SECTION OF THE GROCERY STORE.   3. Do not drink SODA, GATORADE, ENERGY DRINKS, OR DIET SODA.    4. AVOID HIGH FRUCTOSE CORN SYRUP.    5. DO NOT chew SUGAR FREE GUM OR USE ARTIFICIAL SWEETENERS. USE STEVIA AS A SWEETENER.   6. DO NOT EAT ENRICHED WHEAT FLOUR, PASTA, RICE, OR CEREAL.   7. DO NOT EAT FARM RAISED MEAT OR FISH. ONLY EAT WILD CAUGHT SEAFOOD, GRASS FED BEEF OR CHICKEN, OR EGGS FROM PASTURE RAISED CHICKENS.   8. PRACTICE CHAIR YOGA FOR 15-30 MINS 3 OR 4 TIMES A WEEK AND PROGRESS TO HATHA YOGA OVER NEXT 6 MOS.    9. CONTINUE MULTIVITAMIN, VITAMIN B12, AND VITAMIN D3 2000 IU DAILY.

## 2019-01-06 ENCOUNTER — Ambulatory Visit (HOSPITAL_COMMUNITY): Payer: Commercial Managed Care - PPO

## 2019-01-18 ENCOUNTER — Ambulatory Visit (HOSPITAL_COMMUNITY): Payer: Commercial Managed Care - PPO

## 2019-02-07 ENCOUNTER — Other Ambulatory Visit: Payer: Self-pay

## 2019-02-07 ENCOUNTER — Other Ambulatory Visit: Payer: Self-pay | Admitting: *Deleted

## 2019-02-07 ENCOUNTER — Ambulatory Visit (HOSPITAL_COMMUNITY)
Admission: RE | Admit: 2019-02-07 | Discharge: 2019-02-07 | Disposition: A | Discharge status: Home / Self Care | Payer: Commercial Managed Care - PPO | Source: Ambulatory Visit | Attending: Gastroenterology | Admitting: Gastroenterology

## 2019-02-07 ENCOUNTER — Encounter: Payer: Self-pay | Admitting: Gastroenterology

## 2019-02-07 ENCOUNTER — Other Ambulatory Visit: Payer: Commercial Managed Care - PPO | Admitting: Obstetrics and Gynecology

## 2019-02-07 DIAGNOSIS — R1310 Dysphagia, unspecified: Secondary | ICD-10-CM | POA: Diagnosis present

## 2019-02-07 DIAGNOSIS — Z1211 Encounter for screening for malignant neoplasm of colon: Secondary | ICD-10-CM

## 2019-02-07 NOTE — Progress Notes (Signed)
CC'D TO PCP °

## 2019-02-07 NOTE — Telephone Encounter (Signed)
SEND REFERRAL FOR PT TO HAVE SCREENING TCS AT [facility] WITH PROPOFOL, Dx: AVERAGE RISK SCREENING, HIGH RISK FOR ANESTHESIA(BMI > 50).

## 2019-02-13 ENCOUNTER — Other Ambulatory Visit (HOSPITAL_COMMUNITY): Payer: Self-pay | Admitting: Adult Health

## 2019-02-13 ENCOUNTER — Ambulatory Visit (INDEPENDENT_AMBULATORY_CARE_PROVIDER_SITE_OTHER): Payer: Commercial Managed Care - PPO | Admitting: Adult Health

## 2019-02-13 ENCOUNTER — Other Ambulatory Visit: Payer: Commercial Managed Care - PPO | Admitting: Adult Health

## 2019-02-13 ENCOUNTER — Encounter: Payer: Self-pay | Admitting: Adult Health

## 2019-02-13 ENCOUNTER — Other Ambulatory Visit (HOSPITAL_COMMUNITY)
Admission: RE | Admit: 2019-02-13 | Discharge: 2019-02-13 | Disposition: A | Discharge status: Home / Self Care | Payer: Commercial Managed Care - PPO | Source: Ambulatory Visit | Attending: Adult Health | Admitting: Adult Health

## 2019-02-13 ENCOUNTER — Other Ambulatory Visit: Payer: Self-pay

## 2019-02-13 VITALS — BP 160/102 | HR 64 | Ht 64.0 in | Wt 293.0 lb

## 2019-02-13 DIAGNOSIS — Z1212 Encounter for screening for malignant neoplasm of rectum: Secondary | ICD-10-CM

## 2019-02-13 DIAGNOSIS — Z01419 Encounter for gynecological examination (general) (routine) without abnormal findings: Secondary | ICD-10-CM | POA: Diagnosis not present

## 2019-02-13 DIAGNOSIS — Z08 Encounter for follow-up examination after completed treatment for malignant neoplasm: Secondary | ICD-10-CM | POA: Diagnosis present

## 2019-02-13 DIAGNOSIS — Z90710 Acquired absence of both cervix and uterus: Secondary | ICD-10-CM | POA: Insufficient documentation

## 2019-02-13 DIAGNOSIS — Z8541 Personal history of malignant neoplasm of cervix uteri: Secondary | ICD-10-CM

## 2019-02-13 DIAGNOSIS — Z1231 Encounter for screening mammogram for malignant neoplasm of breast: Secondary | ICD-10-CM

## 2019-02-13 DIAGNOSIS — Z1211 Encounter for screening for malignant neoplasm of colon: Secondary | ICD-10-CM

## 2019-02-13 LAB — HEMOCCULT GUIAC POC 1CARD (OFFICE): Fecal Occult Blood, POC: NEGATIVE

## 2019-02-13 NOTE — Progress Notes (Signed)
Patient ID: Jacqueline Parsons, female   DOB: March 24, 1971, 48 y.o.   MRN: [id number] History of Present Illness: Jacqueline Parsons is a 48 year old black female, single,G0P0, sp hysterectomy in 2002 for cervical cancer, in for a pap and physical.  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  She is a new pt. PCP is Dr Jacqueline Parsons.    Current Medications, Allergies, Past Medical History, Past Surgical History, Family History and Social History were reviewed in Jacqueline Parsons record.     Review of Systems:  Patient denies any headaches, hearing loss, fatigue, blurred vision, shortness of breath, chest pain, abdominal pain, problems with bowel movements, urination, or intercourse. No joint pain. She has mood swings and gets angry and started seeing a counselor last week. She is sp gastric by pass.   Physical Exam:BP (!) 160/102 (BP Location: Right Arm, Patient Position: Sitting, Cuff Size: Normal)   Pulse 64   Ht 5\' 4"  (1.626 m)   Wt 293 lb (132.9 kg)   BMI 50.29 kg/m  General:  Well developed, well nourished, no acute distress Skin:  Warm and dry Neck:  Midline trachea, normal thyroid, good ROM, no lymphadenopathy Lungs; Clear to auscultation bilaterally Breast:  No dominant palpable mass, retraction, or nipple discharge Cardiovascular: Regular rate and rhythm Abdomen:  Soft, non tender, no hepatosplenomegaly Pelvic:  External genitalia is normal in appearance, no lesions.  The vagina is normal in appearance, no lesions,pap with HPV, high risk performed. Urethra has no lesions or masses. The cervix and uterus are absent.  No adnexal masses or tenderness noted.Bladder is non tender, no masses felt. Rectal: Good sphincter tone, no polyps, or hemorrhoids felt.  Hemoccult negative. Extremities/musculoskeletal:  No swelling or varicosities noted, no clubbing or cyanosis Psych:  No mood changes, alert and cooperative,seems happy Fall risk is low PHQ 9 score is 7 she denies any suicidal  ideations, and does not want meds yet.  Examination chaperoned by Jacqueline Parsons Rash LPN. She did not take BP meds last night.  Impression: 1. Vaginal Pap smear following hysterectomy for malignancy   2. Screening for colorectal cancer   3. History of cervical cancer       Plan: Physical in 1 year Mammogram now and yearly Labs with PCP Increase water and decrease salt Try to walk more  Get nurse at work to check BP

## 2019-02-14 ENCOUNTER — Other Ambulatory Visit: Payer: Self-pay | Admitting: Adult Health

## 2019-02-14 MED ORDER — BUSPIRONE HCL 5 MG PO TABS: 5.0000 mg | ORAL_TABLET | Freq: Three times a day (TID) | ORAL | 2 refills | Status: DC

## 2019-02-14 NOTE — Progress Notes (Signed)
rx buspar

## 2019-02-17 LAB — CYTOLOGY - PAP
Diagnosis: NEGATIVE
High risk HPV: NEGATIVE
Molecular Disclaimer: 56
Molecular Disclaimer: NORMAL

## 2019-02-20 ENCOUNTER — Other Ambulatory Visit: Payer: Self-pay

## 2019-02-20 ENCOUNTER — Ambulatory Visit (HOSPITAL_COMMUNITY)
Admission: RE | Admit: 2019-02-20 | Discharge: 2019-02-20 | Disposition: A | Discharge status: Home / Self Care | Payer: Commercial Managed Care - PPO | Source: Ambulatory Visit | Attending: Adult Health | Admitting: Adult Health

## 2019-02-20 DIAGNOSIS — Z1231 Encounter for screening mammogram for malignant neoplasm of breast: Secondary | ICD-10-CM

## 2019-03-09 DIAGNOSIS — E079 Disorder of thyroid, unspecified: Secondary | ICD-10-CM | POA: Insufficient documentation

## 2019-05-19 HISTORY — PX: OTHER SURGICAL HISTORY: SHX169

## 2019-06-01 ENCOUNTER — Other Ambulatory Visit: Payer: Self-pay

## 2019-06-01 ENCOUNTER — Ambulatory Visit: Payer: Commercial Managed Care - PPO | Attending: Internal Medicine

## 2019-06-01 DIAGNOSIS — Z20822 Contact with and (suspected) exposure to covid-19: Secondary | ICD-10-CM

## 2019-06-02 LAB — NOVEL CORONAVIRUS, NAA: SARS-CoV-2, NAA: NOT DETECTED

## 2019-06-20 ENCOUNTER — Encounter: Payer: Self-pay | Admitting: Gastroenterology

## 2019-10-13 IMAGING — DX DG CHEST 2V
2 series · 2 of 2 positions shown · non-contrast
Comparison: None in PACs

CLINICAL DATA: Onset of mid chest pain this morning. Nonsmoker.
History of bariatric surgery, history of cervical cancer with
hysterectomy.

EXAM:
CHEST  2 VIEW

[chest pa]
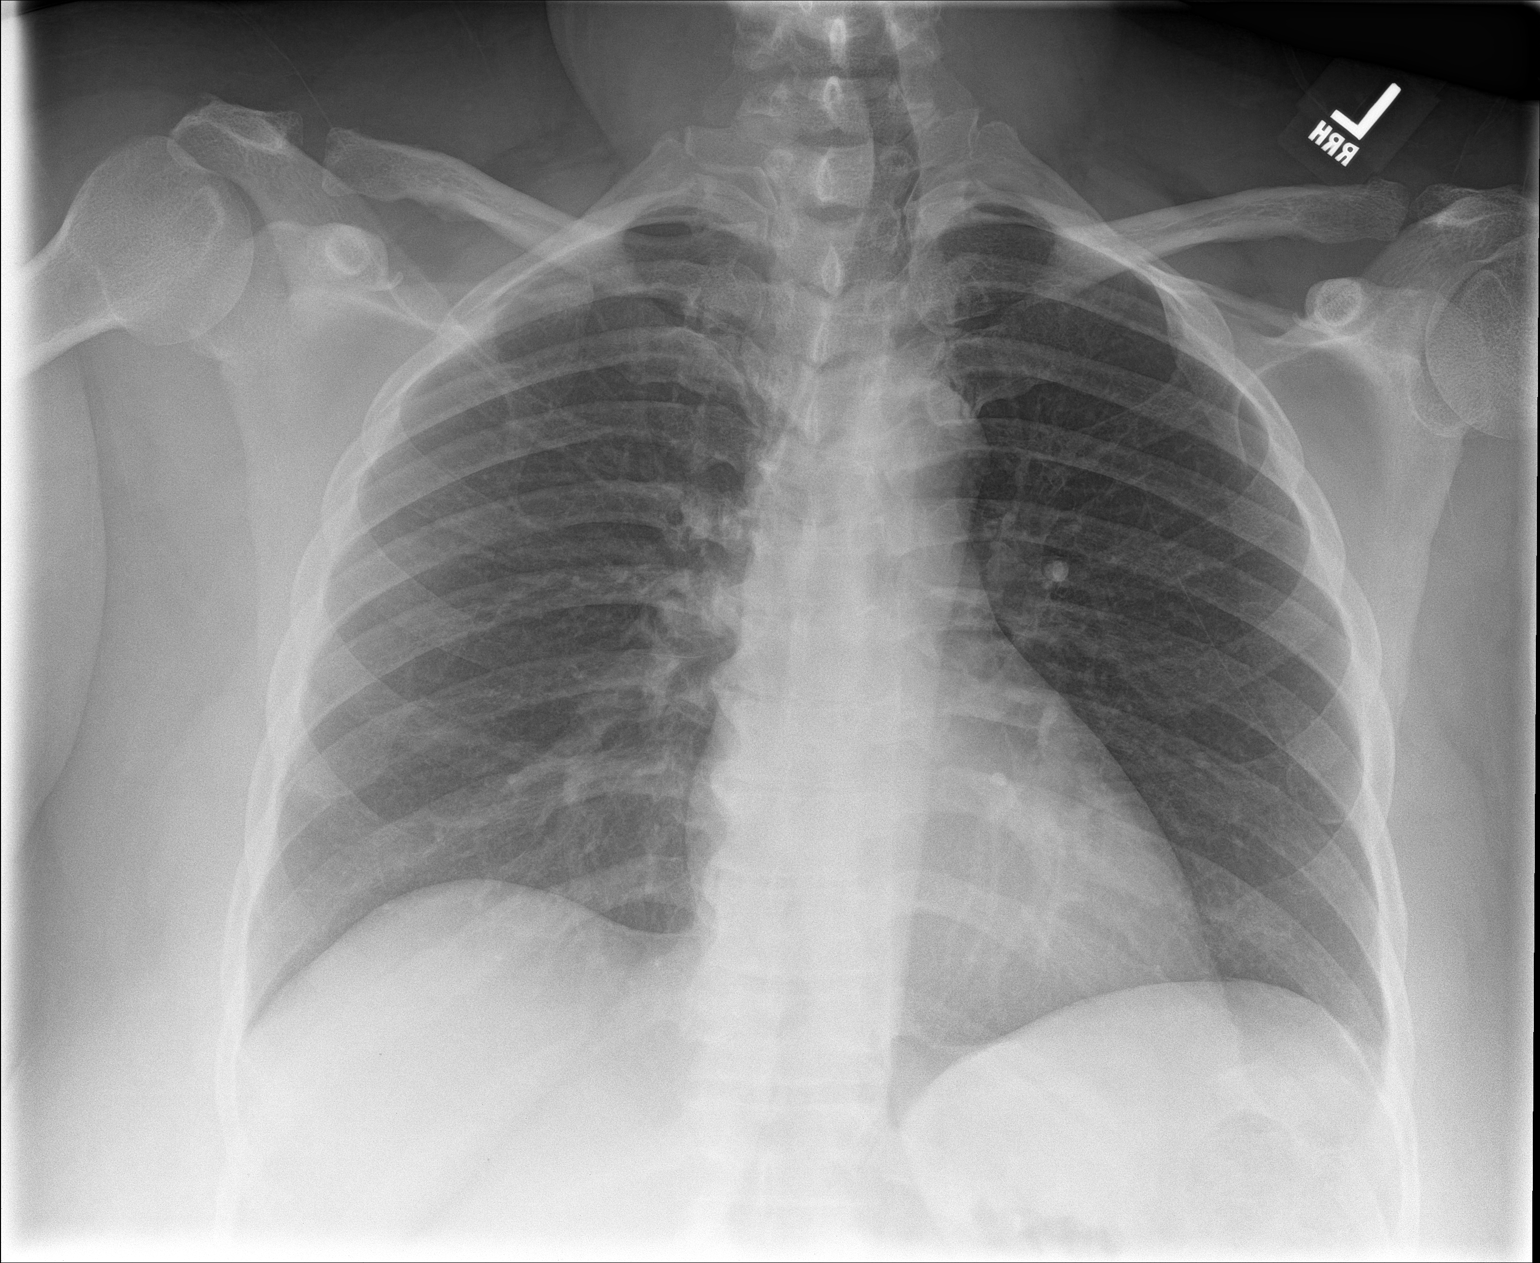

[chest lat]
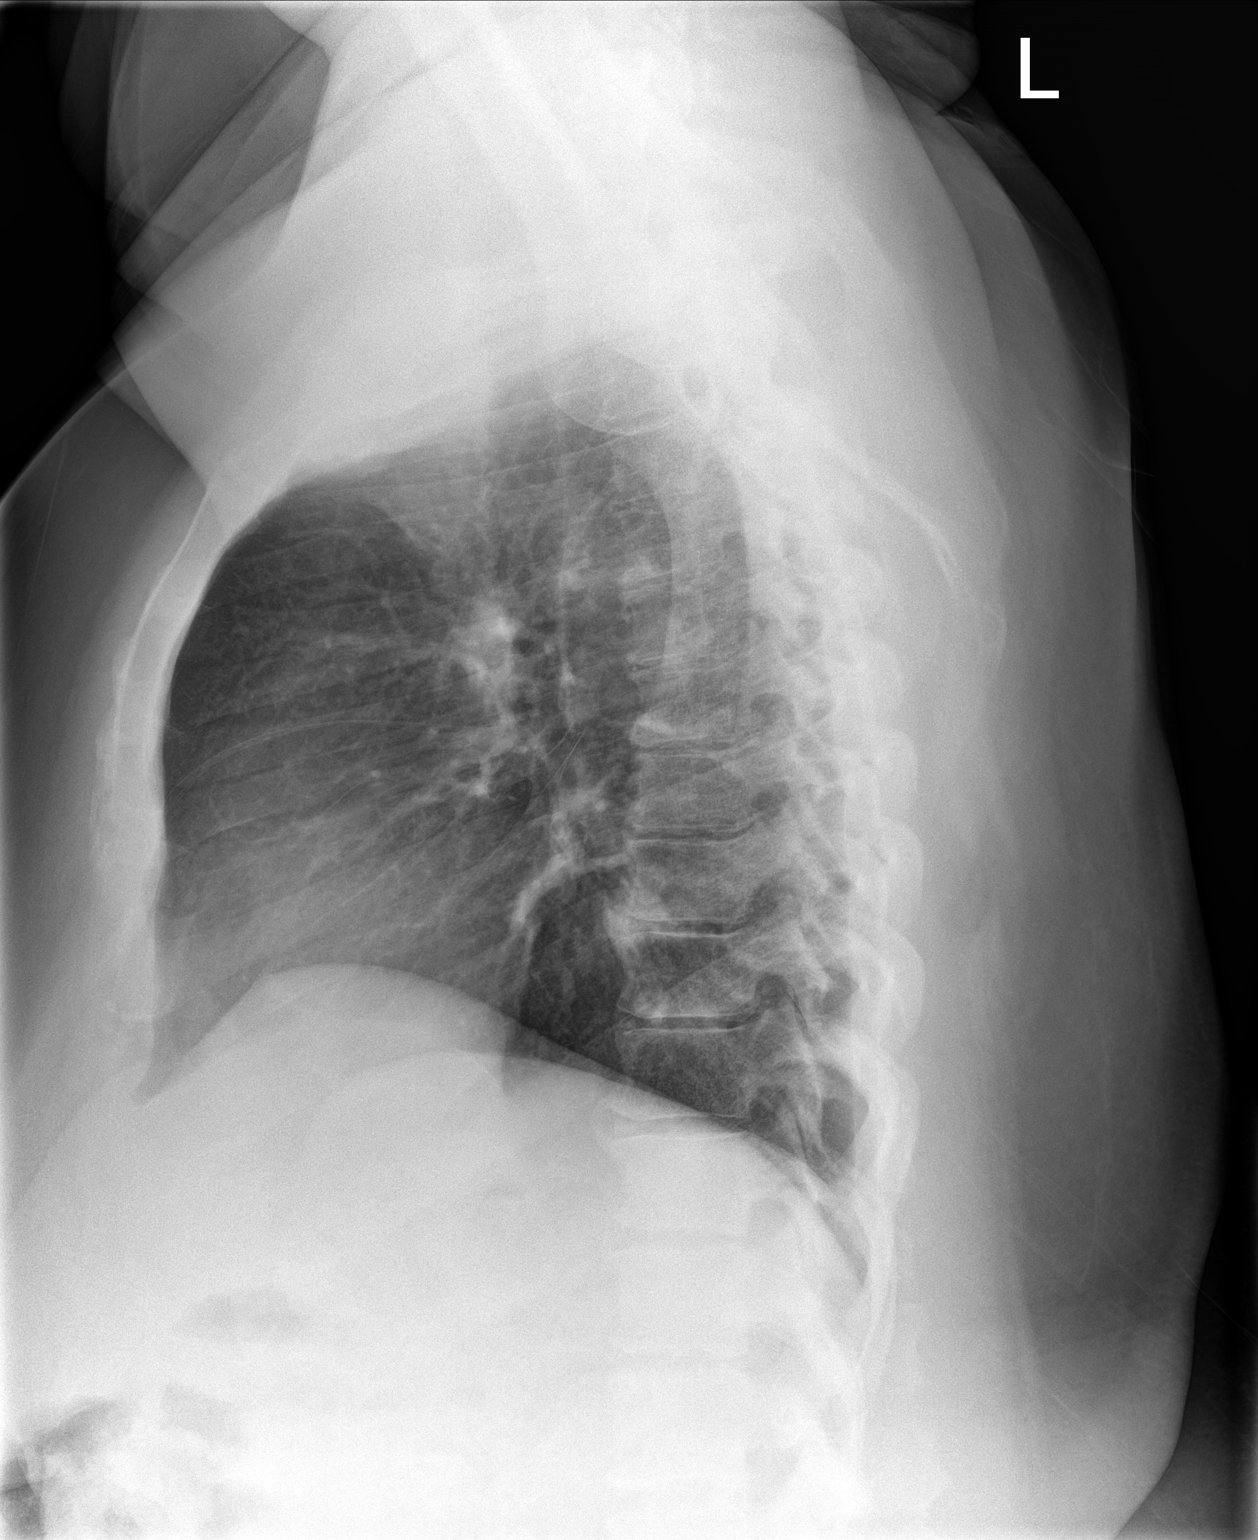

[2 of 2 positions shown; findings below may reference images not displayed]

FINDINGS: The lungs are well-expanded and clear. The heart and pulmonary
vascularity are normal. The mediastinum is normal in width. There is
deviation of the trachea toward the left at the base of the neck.
There is no pleural effusion. There is mild multilevel degenerative
disc disease of the thoracic spine. There is mild widening of the
right AC joint.
IMPRESSION: There is no pneumonia, CHF, nor other acute cardiopulmonary
abnormality.

Deviation of the trachea toward the left may reflect a goiter.
Clinical examination and possibly thyroid ultrasound would be
useful.

Multilevel degenerative disc disease of the thoracic spine.

## 2020-03-14 ENCOUNTER — Other Ambulatory Visit (HOSPITAL_COMMUNITY): Payer: Self-pay | Admitting: Adult Health

## 2020-03-14 DIAGNOSIS — Z1231 Encounter for screening mammogram for malignant neoplasm of breast: Secondary | ICD-10-CM

## 2020-03-18 ENCOUNTER — Other Ambulatory Visit: Payer: Self-pay

## 2020-03-18 ENCOUNTER — Ambulatory Visit (HOSPITAL_COMMUNITY)
Admission: RE | Admit: 2020-03-18 | Discharge: 2020-03-18 | Disposition: A | Discharge status: Home / Self Care | Payer: Commercial Managed Care - PPO | Source: Ambulatory Visit | Attending: Adult Health | Admitting: Adult Health

## 2020-03-18 DIAGNOSIS — Z1231 Encounter for screening mammogram for malignant neoplasm of breast: Secondary | ICD-10-CM

## 2020-08-15 ENCOUNTER — Other Ambulatory Visit (HOSPITAL_COMMUNITY): Payer: Self-pay | Admitting: Family Medicine

## 2020-08-15 DIAGNOSIS — E041 Nontoxic single thyroid nodule: Secondary | ICD-10-CM

## 2020-08-30 ENCOUNTER — Ambulatory Visit (HOSPITAL_COMMUNITY)
Admission: RE | Admit: 2020-08-30 | Discharge: 2020-08-30 | Disposition: A | Discharge status: Home / Self Care | Payer: Commercial Managed Care - PPO | Source: Ambulatory Visit | Attending: Family Medicine | Admitting: Family Medicine

## 2020-08-30 ENCOUNTER — Other Ambulatory Visit: Payer: Self-pay

## 2020-08-30 DIAGNOSIS — E041 Nontoxic single thyroid nodule: Secondary | ICD-10-CM | POA: Diagnosis not present

## 2020-09-18 ENCOUNTER — Ambulatory Visit (INDEPENDENT_AMBULATORY_CARE_PROVIDER_SITE_OTHER): Payer: Commercial Managed Care - PPO | Admitting: Adult Health

## 2020-09-18 ENCOUNTER — Other Ambulatory Visit: Payer: Self-pay

## 2020-09-18 ENCOUNTER — Encounter: Payer: Self-pay | Admitting: Adult Health

## 2020-09-18 VITALS — BP 154/90 | HR 71 | Ht 64.0 in | Wt 306.5 lb

## 2020-09-18 DIAGNOSIS — Z01419 Encounter for gynecological examination (general) (routine) without abnormal findings: Secondary | ICD-10-CM | POA: Diagnosis not present

## 2020-09-18 DIAGNOSIS — Z1211 Encounter for screening for malignant neoplasm of colon: Secondary | ICD-10-CM | POA: Diagnosis not present

## 2020-09-18 LAB — HEMOCCULT GUIAC POC 1CARD (OFFICE): Fecal Occult Blood, POC: NEGATIVE

## 2020-09-18 NOTE — Progress Notes (Signed)
Patient ID: Jacqueline Parsons, female   DOB: October 16, 1970, 50 y.o.   MRN: [id number] History of Present Illness: Jacqueline Parsons is a 50 year old black female, single, engaged, getting married in August, sp hysterectomy in for well woman gyn exam. She works at [employer] PCP is Dr Gerarda Fraction.   Current Medications, Allergies, Past Medical History, Past Surgical History, Family History and Social History were reviewed in [facility] record.     Review of Systems: Patient denies any headaches, hearing loss, fatigue, blurred vision, shortness of breath, chest pain, abdominal pain, problems with bowel movements, urination, or intercourse. No joint pain or mood swings.    Physical Exam:BP (!) 154/90 (BP Location: Left Arm, Patient Position: Sitting, Cuff Size: Large)   Pulse 71   Ht 5\' 4"  (1.626 m)   Wt (!) 306 lb 8 oz (139 kg)   BMI 52.61 kg/m  General:  Well developed, well nourished, no acute distress Skin:  Warm and dry Neck:  Midline trachea, enlarged thyroid(has known goiter), good ROM, no lymphadenopathy Lungs; Clear to auscultation bilaterally Breast:  No dominant palpable mass, retraction, or nipple discharge Cardiovascular: Regular rate and rhythm Abdomen:  Soft, non tender, no hepatosplenomegaly Pelvic:  External genitalia is normal in appearance, no lesions.  The vagina is normal in appearance. Urethra has no lesions or masses. The cervix and uterus are absent. No adnexal masses or tenderness noted.Bladder is non tender, no masses felt. Rectal: Good sphincter tone, no polyps, or hemorrhoids felt.  Hemoccult negative. Extremities/musculoskeletal:  No swelling or varicosities noted, no clubbing or cyanosis Psych:  No mood changes, alert and cooperative,seems happy AA is 1 Fall risk is low PHQ 9 score is 10  GAD 7 score is 7, she thinks it is over wedding, declines meds  [facility] - 09/18/20 1537      Pregnancy Intention Screening   Does the patient want to become pregnant  in the next year? N/A    Does the patient's partner want to become pregnant in the next year? N/A    Would the patient like to discuss contraceptive options today? N/A      Contraception Wrap Up   Current Method Female Sterilization   hyst   End Method Female Sterilization   hyst   Contraception Counseling Provided No         Examination chaperoned by Diona Fanti CMA.   Impression and Plan: 1. Encounter for well woman exam with routine gynecological exam Physical in 1 year Mammogram yearly Labs with PCP Colonoscopy per GI  2. Encounter for screening fecal occult blood testing

## 2021-03-21 ENCOUNTER — Other Ambulatory Visit (HOSPITAL_COMMUNITY): Payer: Self-pay | Admitting: Family Medicine

## 2021-03-21 DIAGNOSIS — Z1231 Encounter for screening mammogram for malignant neoplasm of breast: Secondary | ICD-10-CM

## 2021-03-24 ENCOUNTER — Ambulatory Visit (HOSPITAL_COMMUNITY)
Admission: RE | Admit: 2021-03-24 | Discharge: 2021-03-24 | Disposition: A | Discharge status: Home / Self Care | Payer: Commercial Managed Care - PPO | Source: Ambulatory Visit | Attending: Family Medicine | Admitting: Family Medicine

## 2021-03-24 DIAGNOSIS — Z1231 Encounter for screening mammogram for malignant neoplasm of breast: Secondary | ICD-10-CM | POA: Diagnosis present

## 2021-06-07 ENCOUNTER — Other Ambulatory Visit: Payer: Self-pay

## 2021-06-07 ENCOUNTER — Ambulatory Visit
Admission: EM | Admit: 2021-06-07 | Discharge: 2021-06-07 | Disposition: A | Discharge status: Home / Self Care | Payer: Commercial Managed Care - PPO | Attending: Urgent Care | Admitting: Urgent Care

## 2021-06-07 DIAGNOSIS — J069 Acute upper respiratory infection, unspecified: Secondary | ICD-10-CM | POA: Diagnosis not present

## 2021-06-07 DIAGNOSIS — M542 Cervicalgia: Secondary | ICD-10-CM | POA: Diagnosis not present

## 2021-06-07 DIAGNOSIS — R519 Headache, unspecified: Secondary | ICD-10-CM | POA: Insufficient documentation

## 2021-06-07 DIAGNOSIS — R070 Pain in throat: Secondary | ICD-10-CM | POA: Insufficient documentation

## 2021-06-07 LAB — POCT RAPID STREP A (OFFICE): Rapid Strep A Screen: NEGATIVE

## 2021-06-07 MED ORDER — NAPROXEN 500 MG PO TABS: 500.0000 mg | ORAL_TABLET | Freq: Two times a day (BID) | ORAL | 0 refills | Status: DC

## 2021-06-07 MED ORDER — TIZANIDINE HCL 4 MG PO TABS
4.0000 mg | ORAL_TABLET | Freq: Every day | ORAL | 0 refills | Status: DC
Start: 2021-06-07 — End: 2022-05-13

## 2021-06-07 NOTE — ED Provider Notes (Signed)
Jacqueline Parsons   MRN: [id number] DOB: [birth year]  Subjective:   Jacqueline Parsons is a 51 y.o. female presenting for 2-day history of acute onset throat pain and painful swallowing.  She is also developed an intermittent mild headache and some neck pain and stiffness this morning.  Denies runny or stuffy nose, cough, chest pain, shortness of breath or wheezing.  No current facility-administered medications for this encounter.  Current Outpatient Medications:    amLODipine (NORVASC) 5 MG tablet, , Disp: , Rfl: 1   Ascorbic Acid (VITAMIN C PO), Take by mouth., Disp: , Rfl:    calcium gluconate 500 MG tablet, Take 1 tablet by mouth daily., Disp: , Rfl:    Cholecalciferol (VITAMIN D3) 5000 units TABS, Take by mouth daily. , Disp: , Rfl:    diltiazem (DILACOR XR) 240 MG 24 hr capsule, Take 240 mg by mouth daily., Disp: , Rfl:    Green Tea, Camellia sinensis, (GREEN TEA PO), Take by mouth., Disp: , Rfl:    levothyroxine (SYNTHROID, LEVOTHROID) 88 MCG tablet, Take 88 mcg by mouth daily., Disp: , Rfl: 3   lisinopril (PRINIVIL,ZESTRIL) 20 MG tablet, Take 20 mg by mouth daily. , Disp: , Rfl: 3   Magnesium Oxide 500 MG TABS, Take by mouth., Disp: , Rfl:    pediatric multivitamin-iron (POLY-VI-SOL WITH IRON) 15 MG chewable tablet, Chew 1 tablet by mouth daily., Disp: , Rfl:    pravastatin (PRAVACHOL) 40 MG tablet, Take 40 mg by mouth daily., Disp: , Rfl:    valACYclovir (VALTREX) 500 MG tablet, as needed., Disp: , Rfl:    vitamin B-12 (CYANOCOBALAMIN) 500 MCG tablet, Take by mouth., Disp: , Rfl:    No Known Allergies  Past Medical History:  Diagnosis Date   Cervical cancer ([place]) 2002   Gastric bypass status for obesity 2015   Hypertension    Thyroid disease      Past Surgical History:  Procedure Laterality Date   ABDOMINAL HYSTERECTOMY     arm reduction Bilateral 2021   CARPAL TUNNEL RELEASE     ROUX-EN-Y PROCEDURE  2015    Family History  Problem Relation Age of Onset    Alcoholism Maternal Grandmother    Cancer Maternal Grandfather        lung   Hypertension Maternal Aunt    Thyroid disease Neg Hx    Colon cancer Neg Hx    Colon polyps Neg Hx     Social History   Tobacco Use   Smoking status: Never   Smokeless tobacco: Never  Vaping Use   Vaping Use: Never used  Substance Use Topics   Alcohol use: Yes    Comment: occ   Drug use: No    ROS   Objective:   Vitals: BP (!) 166/94 (BP Location: Right Wrist)    Pulse 80    Temp 98.6 F (37 C) (Oral)    Resp 18    SpO2 96%   Physical Exam Constitutional:      General: She is not in acute distress.    Appearance: Normal appearance. She is well-developed and normal weight. She is not ill-appearing, toxic-appearing or diaphoretic.  HENT:     Head: Normocephalic and atraumatic.     Right Ear: Tympanic membrane, ear canal and external ear normal. No drainage or tenderness. No middle ear effusion. There is no impacted cerumen. Tympanic membrane is not erythematous.     Left Ear: Tympanic membrane, ear canal and external ear normal. No drainage or  tenderness.  No middle ear effusion. There is no impacted cerumen. Tympanic membrane is not erythematous.     Nose: No congestion or rhinorrhea.     Mouth/Throat:     Mouth: Mucous membranes are moist. No oral lesions.     Pharynx: No pharyngeal swelling, oropharyngeal exudate, posterior oropharyngeal erythema or uvula swelling.     Tonsils: No tonsillar exudate or tonsillar abscesses.  Eyes:     General: No scleral icterus.       Right eye: No discharge.        Left eye: No discharge.     Extraocular Movements: Extraocular movements intact.     Right eye: Normal extraocular motion.     Left eye: Normal extraocular motion.     Conjunctiva/sclera: Conjunctivae normal.  Cardiovascular:     Rate and Rhythm: Normal rate.  Pulmonary:     Effort: Pulmonary effort is normal.  Musculoskeletal:     Cervical back: Normal range of motion and neck supple.   Lymphadenopathy:     Cervical: No cervical adenopathy.  Skin:    General: Skin is warm and dry.  Neurological:     General: No focal deficit present.     Mental Status: She is alert and oriented to person, place, and time.     Cranial Nerves: No cranial nerve deficit, dysarthria or facial asymmetry.     Motor: No weakness, tremor, atrophy, abnormal muscle tone, seizure activity or pronator drift.     Coordination: Romberg sign negative. Coordination normal. Finger-Nose-Finger Test normal.     Gait: Gait normal.     Comments: Negative Kernig and Brudzinski.  Psychiatric:        Mood and Affect: Mood normal.        Behavior: Behavior normal.        Thought Content: Thought content normal.        Judgment: Judgment normal.    Results for orders placed or performed during the hospital encounter of 06/07/21 (from the past 24 hour(s))  POCT rapid strep A     Status: None   Collection Time: 06/07/21  8:59 AM  Result Value Ref Range   Rapid Strep A Screen Negative Negative    Assessment and Plan :   PDMP not reviewed this encounter.  1. Viral upper respiratory infection   2. Throat pain   3. Neck pain   4. Mild headache    Strep culture pending.  No suspicion for an acute encephalopathy, meningitis, stroke.  Patient is compliant with her blood pressure medications.  Recommended managing with supportive care for viral pharyngitis, viral URI, musculoskeletal neck pain.  Will defer ER visit for CT scan, lumbar puncture to rule out an acute encephalopathy as above.  Patient is in agreement.  Use supportive care, testing pending. Counseled patient on potential for adverse effects with medications prescribed/recommended today, ER and return-to-clinic precautions discussed, patient verbalized understanding.    Jaynee Eagles, [place] 06/07/21 [id number]

## 2021-06-07 NOTE — Discharge Instructions (Addendum)
We will notify you of your test results as they arrive and may take between 48-72 hours.  I encourage you to sign up for MyChart if you have not already done so as this can be the easiest way for [place] to communicate results to you online or through a phone app.  Generally, we only contact you if it is a positive test result.  In the meantime, if you develop worsening symptoms including fever, chest pain, shortness of breath despite our current treatment plan then please report to the emergency room as this may be a sign of worsening status from possible viral infection.  Otherwise, we will manage this as a viral syndrome. For sore throat or cough try using a honey-based tea. Use 3 teaspoons of honey with juice squeezed from half lemon. Place shaved pieces of ginger into 1/2-1 cup of water and warm over stove top. Then mix the ingredients and repeat every 4 hours as needed. Please take Tylenol 500mg -650mg  every 6 hours for aches and pains, fevers. Hydrate very well with at least 2 liters of water. Eat light meals such as soups to replenish electrolytes and soft fruits, veggies. Start an antihistamine like Zyrtec for postnasal drainage, sinus congestion.  You can take this together with naproxen to help with pain and inflammation and tizanidine as a muscle relaxant.

## 2021-06-07 NOTE — ED Triage Notes (Signed)
Pt reports sore throat x 2 days. "Neck feels stiff".  States is painful when swallows.

## 2021-06-08 LAB — SARS-COV-2, NAA 2 DAY TAT

## 2021-06-08 LAB — NOVEL CORONAVIRUS, NAA: SARS-CoV-2, NAA: NOT DETECTED

## 2021-06-10 LAB — CULTURE, GROUP A STREP (THRC)

## 2021-06-17 ENCOUNTER — Other Ambulatory Visit: Payer: Self-pay

## 2021-06-17 ENCOUNTER — Encounter: Payer: Commercial Managed Care - PPO | Attending: Family Medicine | Admitting: Nutrition

## 2021-06-17 NOTE — Progress Notes (Signed)
She got sick and had to go home. Will r/s.

## 2021-09-22 ENCOUNTER — Encounter: Payer: Self-pay | Admitting: Adult Health

## 2021-09-22 ENCOUNTER — Ambulatory Visit (INDEPENDENT_AMBULATORY_CARE_PROVIDER_SITE_OTHER): Payer: Commercial Managed Care - PPO | Admitting: Adult Health

## 2021-09-22 VITALS — BP 160/96 | HR 65 | Ht 64.0 in | Wt 315.0 lb

## 2021-09-22 DIAGNOSIS — F32A Depression, unspecified: Secondary | ICD-10-CM

## 2021-09-22 DIAGNOSIS — R4589 Other symptoms and signs involving emotional state: Secondary | ICD-10-CM | POA: Insufficient documentation

## 2021-09-22 DIAGNOSIS — R232 Flushing: Secondary | ICD-10-CM | POA: Insufficient documentation

## 2021-09-22 DIAGNOSIS — R61 Generalized hyperhidrosis: Secondary | ICD-10-CM | POA: Diagnosis not present

## 2021-09-22 DIAGNOSIS — Z1211 Encounter for screening for malignant neoplasm of colon: Secondary | ICD-10-CM | POA: Diagnosis not present

## 2021-09-22 DIAGNOSIS — Z01419 Encounter for gynecological examination (general) (routine) without abnormal findings: Secondary | ICD-10-CM | POA: Insufficient documentation

## 2021-09-22 DIAGNOSIS — Z90710 Acquired absence of both cervix and uterus: Secondary | ICD-10-CM

## 2021-09-22 DIAGNOSIS — G479 Sleep disorder, unspecified: Secondary | ICD-10-CM

## 2021-09-22 DIAGNOSIS — F419 Anxiety disorder, unspecified: Secondary | ICD-10-CM | POA: Insufficient documentation

## 2021-09-22 DIAGNOSIS — N951 Menopausal and female climacteric states: Secondary | ICD-10-CM | POA: Insufficient documentation

## 2021-09-22 LAB — HEMOCCULT GUIAC POC 1CARD (OFFICE): Fecal Occult Blood, POC: NEGATIVE

## 2021-09-22 NOTE — Progress Notes (Signed)
Patient ID: Jacqueline Parsons, female   DOB: Feb 20, 1971, 51 y.o.   MRN: [id number] ?History of Present Illness: ?Jacqueline Parsons is a 51 year old black female,married, sp hysterectomy, in for a well woman gyn exam. She is having hot flashes, night sweats and is moody and not sleeping, well. She is taking OTC menopause relief. ?She works at [employer] and sits all day at computer, and requests note for desk riser for computer and better chair, note given. ?PCP is Jacqueline Parsons. ? ? ?Current Medications, Allergies, Past Medical History, Past Surgical History, Family History and Social History were reviewed in [employer] record.   ? ? ?Review of Systems: ?Patient denies any headaches, hearing loss, fatigue, blurred vision, shortness of breath, chest pain, abdominal pain, problems with bowel movements, urination, or intercourse. No joint pain or mood swings.  ?See HPI for positives. ? ? ?Physical Exam:BP (!) 160/96 (BP Location: Right Arm, Patient Position: Sitting, Cuff Size: Large)   Pulse 65   Ht '5\' 4"'$  (1.626 m)   Wt (!) 315 lb (142.9 kg)   BMI 54.07 kg/m?   ?General:  Well developed, well nourished, no acute distress ?Skin:  Warm and dry ?Neck:  Midline trachea, enlarged  thyroid, good ROM, no lymphadenopathy ?Lungs; Clear to auscultation bilaterally ?Breast:  No dominant palpable mass, retraction, or nipple discharge ?Cardiovascular: Regular rate and rhythm ?Abdomen:  Soft, non tender, no hepatosplenomegaly ?Pelvic:  External genitalia is normal in appearance, no lesions.  The vagina is normal in appearance. Urethra has no lesions or masses. The cervix and uterus are absent. No adnexal masses or tenderness noted.Bladder is non tender, no masses felt. ?Rectal: Good sphincter tone, no polyps, or hemorrhoids felt.  Hemoccult negative. ?Extremities/musculoskeletal:  No swelling or varicosities noted, no clubbing or cyanosis ?Psych:  No mood changes, alert and cooperative,seems happy ?AA is 1 ? ?  09/22/2021  ?   2:45 PM 09/18/2020  ?  3:28 PM 02/13/2019  ?  2:29 PM  ?Depression screen PHQ 2/9  ?Decreased Interest '3 2 2  '$ ?Down, Depressed, Hopeless '2 1 2  '$ ?PHQ - 2 Score '5 3 4  '$ ?Altered sleeping 1 2 0  ?Tired, decreased energy '2 2 1  '$ ?Change in appetite '3 2 1  '$ ?Feeling bad or failure about yourself  0 0 0  ?Trouble concentrating 3 1 0  ?Moving slowly or fidgety/restless 1 0 1  ?Suicidal thoughts 0 0 0  ?PHQ-9 Score '15 10 7  '$ ?  ? ?  09/22/2021  ?  2:45 PM 09/18/2020  ?  3:28 PM  ?GAD 7 : Generalized Anxiety Score  ?Nervous, Anxious, on Edge 3 2  ?Control/stop worrying 1 0  ?Worry too much - different things 2 1  ?Trouble relaxing 1 1  ?Restless 1 2  ?Easily annoyed or irritable 3 1  ?Afraid - awful might happen 2 0  ?Total GAD 7 Score 13 7  ? ?  ? Upstream - 09/22/21 1445   ? ?  ? Pregnancy Intention Screening  ? Does the patient want to become pregnant in the next year? No   ? Does the patient's partner want to become pregnant in the next year? No   ? Would the patient like to discuss contraceptive options today? No   ?  ? Contraception Wrap Up  ? Current Method Female Sterilization   hyst  ? End Method Female Sterilization   hyst  ? Contraception Counseling Provided No   ? ?  ?  ? ?  ?  Examination chaperoned by Celene Squibb LPN ? ? ?Impression and Plan: ?1. Encounter for annual routine gynecological examination ?Physical in 1 year ?Labs with PCP ?Mammogram yearly ?colonoscopy per GI ? ?2. Encounter for screening fecal occult blood testing ?Hemoccult is negative  ? ?3. Hot flashes ? ?4. Night sweats ? ?5. Moody ? ?6. Sleep disturbance ? ?7. Perimenopause ?Discussed her symptoms and gave handout on perimenopause  ? ?8. Anxiety and depression ?Declines meds for now  ? ?9. S/P hysterectomy ? ? ? ?  ?  ?

## 2021-09-22 NOTE — Patient Instructions (Signed)
Perimenopause ?Perimenopause is the normal time of a woman's life when the levels of estrogen, the female hormone produced by the ovaries, begin to decrease. This leads to changes in menstrual periods before they stop completely (menopause). Perimenopause can begin 2-8 years before menopause. During perimenopause, the ovaries may or may not produce an egg and a woman can still become pregnant. ?What are the causes? ?This condition is caused by a natural change in hormone levels that happens as you get older. ?What increases the risk? ?This condition is more likely to start at an earlier age if you have certain medical conditions or have undergone treatments, including: ?A tumor of the pituitary gland in the brain. ?A disease that affects the ovaries and hormone production. ?Certain cancer treatments, such as chemotherapy or hormone therapy, or radiation therapy on the pelvis. ?Heavy smoking and excessive alcohol use. ?Family history of early menopause. ?What are the signs or symptoms? ?Perimenopausal changes affect each woman differently. Symptoms of this condition may include: ?Hot flashes. ?Irregular menstrual periods. ?Night sweats. ?Changes in feelings about sex. This could be a decrease in sex drive or an increased discomfort around your sexuality. ?Vaginal dryness. ?Headaches. ?Mood swings. ?Depression. ?Problems sleeping (insomnia). ?Memory problems or trouble concentrating. ?Irritability. ?Tiredness. ?Weight gain. ?Anxiety. ?Trouble getting pregnant. ?How is this diagnosed? ?This condition is diagnosed based on your medical history, a physical exam, your age, your menstrual history, and your symptoms. Hormone tests may also be done. ?How is this treated? ?In some cases, no treatment is needed. You and your health care provider should make a decision together about whether treatment is necessary. Treatment will be based on your individual condition and preferences. Various treatments are available, such  as: ?Menopausal hormone therapy (MHT). ?Medicines to treat specific symptoms. ?Acupuncture. ?Vitamin or herbal supplements. ?Before starting treatment, make sure to let your health care provider know if you have a personal or family history of: ?Heart disease. ?Breast cancer. ?Blood clots. ?Diabetes. ?Osteoporosis. ?Follow these instructions at home: ?Medicines ?Take over-the-counter and prescription medicines only as told by your health care provider. ?Take vitamin supplements only as told by your health care provider. ?Talk with your health care provider before starting any herbal supplements. ?Lifestyle ? ?Do not use any products that contain nicotine or tobacco, such as cigarettes, e-cigarettes, and chewing tobacco. If you need help quitting, ask your health care provider. ?Get at least 30 minutes of physical activity on 5 or more days each week. ?Eat a balanced diet that includes fresh fruits and vegetables, whole grains, soybeans, eggs, lean meat, and low-fat dairy. ?Avoid alcoholic and caffeinated beverages, as well as spicy foods. This may help prevent hot flashes. ?Get 7-8 hours of sleep each night. ?Dress in layers that can be removed to help you manage hot flashes. ?Find ways to manage stress, such as deep breathing, meditation, or journaling. ?General instructions ? ?Keep track of your menstrual periods, including: ?When they occur. ?How heavy they are and how long they last. ?How much time passes between periods. ?Keep track of your symptoms, noting when they start, how often you have them, and how long they last. ?Use vaginal lubricants or moisturizers to help with vaginal dryness and improve comfort during sex. ?You can still become pregnant if you are having irregular periods. Make sure you use contraception during perimenopause if you do not want to get pregnant. ?Keep all follow-up visits. This is important. This includes any group therapy or counseling. ?Contact a health care provider if: ?You  have   heavy vaginal bleeding or pass blood clots. Your period lasts more than 2 days longer than normal. Your periods are recurring sooner than 21 days. You bleed after having sex. You have pain during sex. Get help right away if you have: Chest pain, trouble breathing, or trouble talking. Severe depression. Pain when you urinate. Severe headaches. Vision problems. Summary Perimenopause is the time when a woman's body begins to move into menopause. This may happen naturally or as a result of other health problems or medical treatments. Perimenopause can begin 2-8 years before menopause, and it can last for several years. Perimenopausal symptoms can be managed through medicines, lifestyle changes, and complementary therapies such as acupuncture. This information is not intended to replace advice given to you by your health care provider. Make sure you discuss any questions you have with your health care provider. Document Revised: 10/19/2019 Document Reviewed: 10/19/2019 Elsevier Patient Education  2023 Elsevier Inc.  

## 2021-09-23 ENCOUNTER — Other Ambulatory Visit: Payer: Self-pay | Admitting: Adult Health

## 2021-09-23 MED ORDER — ESCITALOPRAM OXALATE 10 MG PO TABS: 10.0000 mg | ORAL_TABLET | Freq: Every day | ORAL | 2 refills | Status: DC

## 2021-09-23 NOTE — Progress Notes (Signed)
Will rx lexapro ?

## 2021-09-29 ENCOUNTER — Other Ambulatory Visit: Payer: Self-pay | Admitting: Adult Health

## 2021-09-29 DIAGNOSIS — R232 Flushing: Secondary | ICD-10-CM

## 2021-09-29 DIAGNOSIS — R61 Generalized hyperhidrosis: Secondary | ICD-10-CM

## 2021-09-29 NOTE — Progress Notes (Signed)
Will check E2,LH,progesterone and FSH ?

## 2021-09-30 LAB — PROGESTERONE: Progesterone: 2.7 ng/mL

## 2021-09-30 LAB — LUTEINIZING HORMONE: LH: 7.1 m[IU]/mL

## 2021-09-30 LAB — FOLLICLE STIMULATING HORMONE: FSH: 5.4 m[IU]/mL

## 2021-09-30 LAB — ESTRADIOL: Estradiol: 48.8 pg/mL

## 2022-03-10 ENCOUNTER — Other Ambulatory Visit (HOSPITAL_COMMUNITY): Payer: Self-pay | Admitting: Family Medicine

## 2022-03-10 DIAGNOSIS — Z1231 Encounter for screening mammogram for malignant neoplasm of breast: Secondary | ICD-10-CM

## 2022-03-23 ENCOUNTER — Inpatient Hospital Stay (HOSPITAL_COMMUNITY): Admission: RE | Admit: 2022-03-23 | Payer: Commercial Managed Care - PPO | Source: Ambulatory Visit

## 2022-03-23 DIAGNOSIS — Z1231 Encounter for screening mammogram for malignant neoplasm of breast: Secondary | ICD-10-CM

## 2022-03-30 ENCOUNTER — Ambulatory Visit (HOSPITAL_COMMUNITY): Payer: Commercial Managed Care - PPO

## 2022-03-30 ENCOUNTER — Encounter (HOSPITAL_COMMUNITY): Payer: Self-pay

## 2022-04-13 ENCOUNTER — Ambulatory Visit (INDEPENDENT_AMBULATORY_CARE_PROVIDER_SITE_OTHER): Payer: Commercial Managed Care - PPO | Admitting: Orthopedic Surgery

## 2022-04-13 ENCOUNTER — Encounter: Payer: Self-pay | Admitting: Orthopedic Surgery

## 2022-04-13 ENCOUNTER — Ambulatory Visit (INDEPENDENT_AMBULATORY_CARE_PROVIDER_SITE_OTHER): Payer: Commercial Managed Care - PPO

## 2022-04-13 DIAGNOSIS — M25561 Pain in right knee: Secondary | ICD-10-CM

## 2022-04-13 NOTE — Patient Instructions (Signed)
You have strained a tendon in your knee.  Take Advil twice daily for the next 2 weeks.

## 2022-04-13 NOTE — Progress Notes (Signed)
Chief Complaint  Patient presents with   Foot Pain    Right foot pain   New patient 51 year old female played some flag football with some people at her job during a event complains of medial knee pain  No history of prior knee problems  System review no numbness tingling in the right leg  Exam shows tenderness over the pes tendon some over the medial joint line full range of motion normal ligamentous exam neurovascular exam intact  X-ray mild OA no fracture no effusion  Assessment and plan pain right knee no obvious fracture  Recommend do motion exercises quad strengthening and NSAIDs  Return if no improvement after 4 weeks of treatment  Patient anxious to have her right foot evaluated set up a different appointment  Encounter Diagnosis  Name Primary?   Acute pain of right knee Yes   Recommend to do 2 Advil twice a day for 2 weeks and start a stretching strengthening program

## 2022-04-20 ENCOUNTER — Other Ambulatory Visit (HOSPITAL_COMMUNITY): Payer: Self-pay | Admitting: Adult Health

## 2022-04-20 ENCOUNTER — Other Ambulatory Visit (HOSPITAL_COMMUNITY): Payer: Self-pay | Admitting: Family Medicine

## 2022-04-20 ENCOUNTER — Ambulatory Visit (HOSPITAL_COMMUNITY)
Admission: RE | Admit: 2022-04-20 | Discharge: 2022-04-20 | Disposition: A | Discharge status: Home / Self Care | Payer: Commercial Managed Care - PPO | Source: Ambulatory Visit | Attending: Adult Health | Admitting: Adult Health

## 2022-04-20 DIAGNOSIS — Z1231 Encounter for screening mammogram for malignant neoplasm of breast: Secondary | ICD-10-CM | POA: Insufficient documentation

## 2022-05-13 ENCOUNTER — Ambulatory Visit (INDEPENDENT_AMBULATORY_CARE_PROVIDER_SITE_OTHER): Payer: Commercial Managed Care - PPO

## 2022-05-13 ENCOUNTER — Encounter: Payer: Self-pay | Admitting: Orthopedic Surgery

## 2022-05-13 ENCOUNTER — Ambulatory Visit (INDEPENDENT_AMBULATORY_CARE_PROVIDER_SITE_OTHER): Payer: Commercial Managed Care - PPO | Admitting: Orthopedic Surgery

## 2022-05-13 VITALS — BP 167/85 | HR 79 | Ht 64.0 in | Wt 320.6 lb

## 2022-05-13 DIAGNOSIS — M79671 Pain in right foot: Secondary | ICD-10-CM

## 2022-05-13 DIAGNOSIS — G8929 Other chronic pain: Secondary | ICD-10-CM

## 2022-05-13 NOTE — Progress Notes (Signed)
Chief Complaint  Patient presents with   Foot Pain    RT foot/ dropped a 5 lb dumb bell on it ~7 years ago/ foot swells daily/ pain comes and goes   Jacqueline Parsons is 51 years old she dropped a dumbbell on her right foot 7 years ago.  She did have x-rays at that time there was no evidence of fracture.  But since that time her right foot swells more than the left.  This requires different shoe sizes.  The swelling persists and gets worse by the end of the day she has dorsal foot pain.  Examination shows that the right foot is swollen more than the left  She does have some tenderness at the MTP joints on the right  Imaging was obtained  The x-ray shows no evidence of fracture or bony abnormality there is a plantar-and Achilles spur  I expect she had injury to the small veins of the foot leading to chronic edema  There is no treatment for this other than compression stockings

## 2022-05-25 ENCOUNTER — Ambulatory Visit
Admission: EM | Admit: 2022-05-25 | Discharge: 2022-05-25 | Disposition: A | Discharge status: Home / Self Care | Payer: Commercial Managed Care - PPO | Attending: Family Medicine | Admitting: Family Medicine

## 2022-05-25 DIAGNOSIS — S46911A Strain of unspecified muscle, fascia and tendon at shoulder and upper arm level, right arm, initial encounter: Secondary | ICD-10-CM

## 2022-05-25 MED ORDER — TIZANIDINE HCL 4 MG PO CAPS: 4.0000 mg | ORAL_CAPSULE | Freq: Three times a day (TID) | ORAL | 0 refills | Status: DC | PRN

## 2022-05-25 MED ORDER — DEXAMETHASONE SODIUM PHOSPHATE 10 MG/ML IJ SOLN
10.0000 mg | Freq: Once | INTRAMUSCULAR | Status: AC
  Administered 2022-05-25: 10 mg via INTRAMUSCULAR

## 2022-05-25 NOTE — ED Triage Notes (Addendum)
Pt reports her right shoulder started hurting x 3 days ago. Took tylenol, heat pack,  mucsle relaxer , and cold pack which gave slight relief.   Pt reports no injury to R shoulder.

## 2022-05-25 NOTE — ED Provider Notes (Signed)
[facility]    CSN: [id number] Arrival date & time: 05/25/22  1031      History   Chief Complaint Chief Complaint  Patient presents with   Shoulder Pain    HPI Jacqueline Parsons is a 52 y.o. female.   Patient presenting today with 3-day history of right anterior shoulder pain that has progressively worsened and is now limiting movement due to pain.  Denies numbness, tingling, known injury to the area, weakness in the arm.  Taking Tylenol and using heat and cold with minimal relief.  States she had a similar issue about 8 years ago when she strained her rotator cuff.    Past Medical History:  Diagnosis Date   Cervical cancer ([place]) 2002   Gastric bypass status for obesity 2015   Hypertension    Thyroid disease     Patient Active Problem List   Diagnosis Date Noted   Perimenopause 09/22/2021   Sleep disturbance 09/22/2021   Moody 09/22/2021   Night sweats 09/22/2021   Hot flashes 09/22/2021   Encounter for annual routine gynecological examination 09/22/2021   Anxiety and depression 09/22/2021   S/P hysterectomy 09/22/2021   Encounter for screening fecal occult blood testing 09/18/2020   Encounter for well woman exam with routine gynecological exam 09/18/2020   History of cervical cancer 02/13/2019   Screening for colorectal cancer 02/13/2019   Vaginal Pap smear following hysterectomy for malignancy 02/13/2019   Dysphagia, idiopathic 01/04/2019   Swelling of right lower extremity 10/29/2017   Essential hypertension 10/29/2017   Hyperlipidemia 10/29/2017   Thyroid nodule 10/13/2017   Adult hypothyroidism 10/13/2017   Morbid obesity ([place]) 2015    Past Surgical History:  Procedure Laterality Date   ABDOMINAL HYSTERECTOMY     arm reduction Bilateral 2021   CARPAL TUNNEL RELEASE     ROUX-EN-Y PROCEDURE  2015    OB History     Gravida  0   Para  0   Term  0   Preterm  0   AB  0   Living  0      SAB  0   IAB  0   Ectopic  0    Multiple  0   Live Births  0            Home Medications    Prior to Admission medications   Medication Sig Start Date End Date Taking? Authorizing Provider  tiZANidine (ZANAFLEX) 4 MG capsule Take 1 capsule (4 mg total) by mouth 3 (three) times daily as needed for muscle spasms. Do not drink alcohol or drive while taking this medication.  May cause drowsiness. 05/25/22  Yes Volney American, PA-C  Ascorbic Acid (VITAMIN C PO) Take by mouth.    [provider]  calcium gluconate 500 MG tablet Take 1 tablet by mouth daily.    [provider]  Cholecalciferol (VITAMIN D3) 5000 units TABS Take by mouth daily.     [provider]  diltiazem (DILACOR XR) 240 MG 24 hr capsule Take 240 mg by mouth daily.    [provider]  levothyroxine (SYNTHROID, LEVOTHROID) 88 MCG tablet Take 88 mcg by mouth daily. 08/23/17   [provider]  lisinopril (PRINIVIL,ZESTRIL) 20 MG tablet Take 20 mg by mouth daily.  08/24/17   [provider]  pediatric multivitamin-iron (POLY-VI-SOL WITH IRON) 15 MG chewable tablet Chew 1 tablet by mouth daily.    [provider]  pravastatin (PRAVACHOL) 40 MG tablet Take 40  mg by mouth daily.    [provider]  promethazine-dextromethorphan (PROMETHAZINE-DM) 6.25-15 MG/5ML syrup Take 5 mLs by mouth every 6 (six) hours as needed. 05/06/22   [provider]  valACYclovir (VALTREX) 500 MG tablet as needed. 11/14/18   [provider]  vitamin B-12 (CYANOCOBALAMIN) 500 MCG tablet Take by mouth.    [provider]    Family History Family History  Problem Relation Age of Onset   Alcoholism Maternal Grandmother    Cancer Maternal Grandfather        lung   Hypertension Maternal Aunt    Thyroid disease Neg Hx    Colon cancer Neg Hx    Colon polyps Neg Hx     Social History Social History   Tobacco Use   Smoking status: Never   Smokeless tobacco: Never  Vaping Use    Vaping Use: Never used  Substance Use Topics   Alcohol use: Yes    Comment: occ   Drug use: No     Allergies   Patient has no known allergies.   Review of Systems Review of Systems Per HPI  Physical Exam Triage Vital Signs ED Triage Vitals  Enc Vitals Group     BP 05/25/22 1136 (!) 185/84     Pulse Rate 05/25/22 1136 84     Resp 05/25/22 1136 20     Temp 05/25/22 1136 98.7 F (37.1 C)     Temp Source 05/25/22 1136 Oral     SpO2 05/25/22 1136 100 %     Weight --      Height --      Head Circumference --      Peak Flow --      Pain Score 05/25/22 1143 8     Pain Loc --      Pain Edu? --      Excl. in [place]? --    No data found.  Updated Vital Signs BP (!) 185/84 (BP Location: Left Wrist)   Pulse 84   Temp 98.7 F (37.1 C) (Oral)   Resp 20   SpO2 100%   Visual Acuity Right Eye Distance:   Left Eye Distance:   Bilateral Distance:    Right Eye Near:   Left Eye Near:    Bilateral Near:     Physical Exam Vitals and nursing note reviewed.  Constitutional:      Appearance: Normal appearance. She is not ill-appearing.  HENT:     Head: Atraumatic.  Eyes:     Extraocular Movements: Extraocular movements intact.     Conjunctiva/sclera: Conjunctivae normal.  Cardiovascular:     Rate and Rhythm: Normal rate and regular rhythm.     Heart sounds: Normal heart sounds.  Pulmonary:     Effort: Pulmonary effort is normal.     Breath sounds: Normal breath sounds.  Musculoskeletal:        General: Tenderness present. No swelling or deformity.     Cervical back: Normal range of motion and neck supple.     Comments: Range of motion exam of the right shoulder is limited due to patient's significant pain.  Anterior right shoulder tender to palpation and painful with movement in this area.  Grip strength full and equal bilateral hands  Skin:    General: Skin is warm and dry.     Findings: No bruising or erythema.  Neurological:     Mental Status: She is alert and  oriented to person, place, and time.  Motor: No weakness.     Gait: Gait normal.     Comments: Right upper extremity neurovascularly intact  Psychiatric:        Mood and Affect: Mood normal.        Thought Content: Thought content normal.        Judgment: Judgment normal.    UC Treatments / Results  Labs (all labs ordered are listed, but only abnormal results are displayed) Labs Reviewed - No data to display  EKG  Radiology No results found.  Procedures Procedures (including critical care time)  Medications Ordered in UC Medications  dexamethasone (DECADRON) injection 10 mg (10 mg Intramuscular Given 05/25/22 1214)    Initial Impression / Assessment and Plan / UC Course  I have reviewed the triage vital signs and the nursing notes.  Pertinent labs & imaging results that were available during my care of the patient were reviewed by me and considered in my medical decision making (see chart for details).     Do suspect soft tissue strain causing her symptoms.  Treat with IM Decadron, Zanaflex, supportive over-the-counter medications and home care.  Ortho follow-up recommended if not resolving. Final Clinical Impressions(s) / UC Diagnoses   Final diagnoses:  Strain of right shoulder, initial encounter   Discharge Instructions   None    ED Prescriptions     Medication Sig Dispense Auth. Provider   tiZANidine (ZANAFLEX) 4 MG capsule Take 1 capsule (4 mg total) by mouth 3 (three) times daily as needed for muscle spasms. Do not drink alcohol or drive while taking this medication.  May cause drowsiness. 15 capsule Volney American, [place]      PDMP not reviewed this encounter.   Volney American, [place] 05/25/22 1244

## 2022-05-29 ENCOUNTER — Encounter: Payer: Self-pay | Admitting: Orthopedic Surgery

## 2022-05-29 ENCOUNTER — Ambulatory Visit (INDEPENDENT_AMBULATORY_CARE_PROVIDER_SITE_OTHER): Payer: Commercial Managed Care - PPO | Admitting: Orthopedic Surgery

## 2022-05-29 ENCOUNTER — Ambulatory Visit (INDEPENDENT_AMBULATORY_CARE_PROVIDER_SITE_OTHER): Payer: Commercial Managed Care - PPO

## 2022-05-29 VITALS — BP 193/113 | HR 72 | Ht 64.0 in | Wt 320.0 lb

## 2022-05-29 DIAGNOSIS — M7551 Bursitis of right shoulder: Secondary | ICD-10-CM | POA: Diagnosis not present

## 2022-05-29 DIAGNOSIS — M25511 Pain in right shoulder: Secondary | ICD-10-CM

## 2022-05-29 DIAGNOSIS — M7521 Bicipital tendinitis, right shoulder: Secondary | ICD-10-CM | POA: Diagnosis not present

## 2022-05-29 NOTE — Progress Notes (Signed)
Chief Complaint  Patient presents with   Shoulder Pain    Right since Monday 05/25/22 had similar pain 10 years ago also    52 year old female seen by urgent care and diagnosed with shoulder strain although there was no specific injury per se  It looks like she had acute onset of pain and loss of motion  At urgent care she was given a muscle relaxer and an IM shot of medication not sure if it was steroid or ketorolac but anyway today she is sore in the front of the shoulder her motion is better  Physical Exam Vitals and nursing note reviewed.  Constitutional:      Appearance: Normal appearance.  HENT:     Head: Normocephalic and atraumatic.  Eyes:     General: No scleral icterus.       Right eye: No discharge.        Left eye: No discharge.     Extraocular Movements: Extraocular movements intact.     Conjunctiva/sclera: Conjunctivae normal.     Pupils: Pupils are equal, round, and reactive to light.  Cardiovascular:     Rate and Rhythm: Normal rate.     Pulses: Normal pulses.  Musculoskeletal:     Right shoulder: Tenderness present. No swelling, deformity, effusion, laceration or crepitus. Normal range of motion. Normal strength. Normal pulse.     Comments: Provocative tests include the empty can sign which she had 5 out of 5 strength  The speeds test was positive and she was tender over the biceps tendon    Skin:    General: Skin is warm and dry.     Capillary Refill: Capillary refill takes less than 2 seconds.  Neurological:     General: No focal deficit present.     Mental Status: She is alert and oriented to person, place, and time.  Psychiatric:        Mood and Affect: Mood normal.        Behavior: Behavior normal.        Thought Content: Thought content normal.        Judgment: Judgment normal.    Imaging was done in the office.  We did an AP and lateral of the right shoulder it look normal  Diagnosis  Encounter Diagnoses  Name Primary?   Acute pain of right  shoulder Yes   Biceps tendinitis of right upper extremity    Bursitis of right shoulder     Recommend Codman exercises Ibuprofen 600 to 800 mg 3 times a day until pain goes away Follow-up as needed

## 2022-07-16 ENCOUNTER — Encounter: Payer: Self-pay | Admitting: Radiology

## 2022-09-08 ENCOUNTER — Ambulatory Visit (INDEPENDENT_AMBULATORY_CARE_PROVIDER_SITE_OTHER): Payer: Commercial Managed Care - PPO | Admitting: Adult Health

## 2022-09-08 ENCOUNTER — Other Ambulatory Visit (HOSPITAL_COMMUNITY)
Admission: RE | Admit: 2022-09-08 | Discharge: 2022-09-08 | Disposition: A | Discharge status: Home / Self Care | Payer: Commercial Managed Care - PPO | Source: Ambulatory Visit | Attending: Adult Health | Admitting: Adult Health

## 2022-09-08 ENCOUNTER — Encounter: Payer: Self-pay | Admitting: Adult Health

## 2022-09-08 ENCOUNTER — Ambulatory Visit: Payer: Commercial Managed Care - PPO | Admitting: Adult Health

## 2022-09-08 VITALS — BP 146/87 | HR 76 | Ht 64.0 in | Wt 314.0 lb

## 2022-09-08 DIAGNOSIS — Z08 Encounter for follow-up examination after completed treatment for malignant neoplasm: Secondary | ICD-10-CM | POA: Insufficient documentation

## 2022-09-08 DIAGNOSIS — R1031 Right lower quadrant pain: Secondary | ICD-10-CM | POA: Diagnosis not present

## 2022-09-08 DIAGNOSIS — Z90710 Acquired absence of both cervix and uterus: Secondary | ICD-10-CM

## 2022-09-08 DIAGNOSIS — Z8541 Personal history of malignant neoplasm of cervix uteri: Secondary | ICD-10-CM | POA: Diagnosis not present

## 2022-09-08 NOTE — Progress Notes (Signed)
  Subjective:     Patient ID: Jacqueline Parsons, female   DOB: 1970/09/05, 52 y.o.   MRN: [id number]  HPI Oluwatomisin is a 52 year old black female, married, sp hysterectomy for cervical cancer in 2002, in complaining of pain RLQ for 2 days. She needs vaginal pap.  Review of Systems RLQ pain for 2 days  Reviewed past medical,surgical, social and family history. Reviewed medications and allergies.     Objective:   Physical Exam BP (!) 146/87 (BP Location: Left Arm, Patient Position: Sitting, Cuff Size: Large)   Pulse 76   Ht  (1.626 m)   Wt (!) 314 lb (142.4 kg)   BMI 53.90 kg/m  Skin warm and dry.Pelvic: external genitalia is normal in appearance no lesions, vagina: pale pink, no lesions, vaginal pap performed with HR HPV,urethra has no lesions or masses noted, cervix and uterus are absent, adnexa: no masses, +RLQ tenderness noted. Bladder is non tender and no masses felt.  Fall risk is low    09/08/2022    3:33 PM 09/22/2021    2:45 PM 09/18/2020    3:28 PM  Depression screen PHQ 2/9  Decreased Interest 0 3 2  Down, Depressed, Hopeless 0 2 1  PHQ - 2 Score 0 5 3  Altered sleeping  1 2  Tired, decreased energy  2 2  Change in appetite  3 2  Feeling bad or failure about yourself   0 0  Trouble concentrating  3 1  Moving slowly or fidgety/restless  1 0  Suicidal thoughts  0 0  PHQ-9 Score  15 10        Upstream - 09/08/22 1525       Pregnancy Intention Screening   Does the patient want to become pregnant in the next year? N/A    Does the patient's partner want to become pregnant in the next year? N/A    Would the patient like to discuss contraceptive options today? N/A      Contraception Wrap Up   Current Method Female Sterilization   hyst           Examination chaperoned by Faith Rogue LPN  Assessment:     1. RLQ abdominal pain +pain RLQ for 2 days Will get pelvic US 09/17/22 to assess ovaries  - US PELVIC COMPLETE WITH TRANSVAGINAL; Future  2. S/P  hysterectomy  3. History of cervical cancer Pap sent  4. Vaginal Pap smear following hysterectomy for malignancy Pap sent Pap in 3 years if normal - Cytology - PAP     Plan:     Return 09/17/22 for [place] at 3:45 pm

## 2022-09-11 LAB — CYTOLOGY - PAP
Comment: NEGATIVE
Diagnosis: NEGATIVE
High risk HPV: NEGATIVE

## 2022-09-16 ENCOUNTER — Encounter: Payer: Self-pay | Admitting: Allergy & Immunology

## 2022-09-16 ENCOUNTER — Other Ambulatory Visit: Payer: Self-pay

## 2022-09-16 ENCOUNTER — Ambulatory Visit (INDEPENDENT_AMBULATORY_CARE_PROVIDER_SITE_OTHER): Payer: Commercial Managed Care - PPO | Admitting: Allergy & Immunology

## 2022-09-16 VITALS — BP 150/82 | HR 87 | Temp 98.2°F | Resp 18 | Ht 64.0 in | Wt 313.1 lb

## 2022-09-16 DIAGNOSIS — L508 Other urticaria: Secondary | ICD-10-CM | POA: Diagnosis not present

## 2022-09-16 DIAGNOSIS — J310 Chronic rhinitis: Secondary | ICD-10-CM

## 2022-09-16 MED ORDER — MONTELUKAST SODIUM 10 MG PO TABS: 10.0000 mg | ORAL_TABLET | Freq: Every day | ORAL | 5 refills | Status: DC

## 2022-09-16 NOTE — Patient Instructions (Signed)
1. Chronic rhinitis - We will test you at the next visit on FRIDAY at 8:30 am. - This will give [place] some more answers. - Start Singulair (montelukast) 10mg  daily. - This will not affect the testing. - Hopefully we get some answers at the next visit.   2. Chronic urticaria - We will try to figure this out. - This might be dermatographia, which is essentially sensitive skin.  - 5% of the population has this.   3. Follow up on FRIDAY at 8:30am with ANNE!   Please inform us of any Emergency Department visits, hospitalizations, or changes in symptoms. Call us before going to the ED for breathing or allergy symptoms since we might be able to fit you in for a sick visit. Feel free to contact us anytime with any questions, problems, or concerns.  It was a pleasure to meet you today!  Websites that have reliable patient information: 1. American Academy of Asthma, Allergy, and Immunology: www.aaaai.org 2. Food Allergy Research and Education (FARE): foodallergy.org 3. Mothers of Asthmatics: http://www.asthmacommunitynetwork.org 4. American College of Allergy, Asthma, and Immunology: www.acaai.org   COVID-19 Vaccine Information can be found at: [email or web address] For questions related to vaccine distribution or appointments, please email [email or web address] or call [phone].   We realize that you might be concerned about having an allergic reaction to the COVID19 vaccines. To help with that concern, WE ARE OFFERING THE COVID19 VACCINES IN OUR OFFICE! Ask the front desk for dates!     "Like" [place] on Facebook and Instagram for our latest updates!      A healthy democracy works best when [employer] participate! Make sure you are registered to vote! If you have moved or changed any of your contact information, you will need to get this updated before voting!  In some cases, you MAY be able to register to vote online:  [email or web address]

## 2022-09-16 NOTE — Progress Notes (Signed)
NEW PATIENT  Date of Service/Encounter:  09/16/22  Consult requested by: Catalina Lunger, DO   Assessment:   Chronic rhinitis  Chronic urticaria  Plan/Recommendations:   1. Chronic rhinitis - We will test you at the next visit on FRIDAY at 8:30 am. - This will give [place] some more answers. - Start Singulair (montelukast) 10mg  daily. - This will not affect the testing. - Hopefully we get some answers at the next visit.   2. Chronic urticaria - We will try to figure this out. - This might be dermatographia, which is essentially sensitive skin.  - 5% of the population has this.   3. Follow up on FRIDAY at 8:30am with ANNE!   This note in its entirety was forwarded to the Provider who requested this consultation.  Subjective:   Jacqueline Parsons is a 52 y.o. female presenting today for evaluation of  Chief Complaint  Patient presents with   Pruritus    Ayris Parsons has a history of the following: Patient Active Problem List   Diagnosis Date Noted   RLQ abdominal pain 09/08/2022   Perimenopause 09/22/2021   Sleep disturbance 09/22/2021   Moody 09/22/2021   Night sweats 09/22/2021   Hot flashes 09/22/2021   Encounter for annual routine gynecological examination 09/22/2021   Anxiety and depression 09/22/2021   S/P hysterectomy 09/22/2021   Encounter for screening fecal occult blood testing 09/18/2020   Encounter for well woman exam with routine gynecological exam 09/18/2020   History of cervical cancer 02/13/2019   Screening for colorectal cancer 02/13/2019   Vaginal Pap smear following hysterectomy for malignancy 02/13/2019   Dysphagia, idiopathic 01/04/2019   Swelling of right lower extremity 10/29/2017   Essential hypertension 10/29/2017   Hyperlipidemia 10/29/2017   Thyroid nodule 10/13/2017   Adult hypothyroidism 10/13/2017   Morbid obesity (HCC) 2015    History obtained from: chart review and patient.  Jacqueline Parsons was referred by Catalina Lunger, DO.     Jacqueline Parsons) is a 52 y.o. female presenting for an evaluation of hives and environmental allergies . She is from [place] and never had a problem when she lived there. She has been here since 2009. She has lots of family down here.   Allergic Rhinitis Symptom History: She does have a lot of postnasal drip that is present mostly in the spring time when the pollen is thick. She has Flonase that she uses as needed and she has Claritin that she uses daily. She has stashes of it throughout her home and work.  She does have bronchitis and she tends to get sick once a year or less. She has never been allergy tested.   Food Allergy Symptom History: Itching is not correlated with any foods at all. She does eat everything without a problem.  Skin Symptom History: She has been itching for a couple of weeks.  She has intense itching when she gets out of the shower. She typically only has this when she gets out of the shower. She has had this for years, but it is worse and more persistent. 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 She uses Claritin but this does not work like the Benadryl does. She has no joint pain or fevers.   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   Otherwise, there is no history of other atopic diseases, including drug allergies, stinging insect allergies, or contact dermatitis. There is no significant infectious history. Vaccinations are up to date.  Past Medical History: Patient Active Problem List   Diagnosis Date Noted   RLQ abdominal pain 09/08/2022   Perimenopause 09/22/2021   Sleep disturbance 09/22/2021   Moody 09/22/2021   Night sweats 09/22/2021   Hot flashes 09/22/2021   Encounter for annual routine gynecological examination 09/22/2021   Anxiety and depression 09/22/2021   S/P hysterectomy 09/22/2021   Encounter for screening fecal occult blood testing 09/18/2020   Encounter for well woman exam with routine gynecological  exam 09/18/2020   History of cervical cancer 02/13/2019   Screening for colorectal cancer 02/13/2019   Vaginal Pap smear following hysterectomy for malignancy 02/13/2019   Dysphagia, idiopathic 01/04/2019   Swelling of right lower extremity 10/29/2017   Essential hypertension 10/29/2017   Hyperlipidemia 10/29/2017   Thyroid nodule 10/13/2017   Adult hypothyroidism 10/13/2017   Morbid obesity (HCC) 2015    Medication List:  Allergies as of 09/16/2022   No Known Allergies      Medication List        Accurate as of Sep 16, 2022 12:54 PM. If you have any questions, ask your nurse or doctor.          BENADRYL PO Take by mouth.   calcium gluconate 500 MG tablet Take 1 tablet by mouth daily.   CLARITIN PO Take by mouth.   diltiazem 240 MG 24 hr capsule Commonly known as: DILACOR XR Take 240 mg by mouth daily.   fluticasone 50 MCG/ACT nasal spray Commonly known as: FLONASE Place 1 spray into both nostrils daily.   levothyroxine 88 MCG tablet Commonly known as: SYNTHROID Take 88 mcg by mouth daily.   lisinopril 20 MG tablet Commonly known as: ZESTRIL Take 20 mg by mouth daily.   montelukast 10 MG tablet Commonly known as: Singulair Take 1 tablet (10 mg total) by mouth at bedtime. Started by: Alfonse Spruce, MD   pediatric multivitamin-iron 15 MG chewable tablet Chew 1 tablet by mouth daily.   pravastatin 40 MG tablet Commonly known as: PRAVACHOL Take 40 mg by mouth daily.   valACYclovir 500 MG tablet Commonly known as: VALTREX as needed.   vitamin B-12 500 MCG tablet Commonly known as: CYANOCOBALAMIN Take by mouth.   VITAMIN C PO Take by mouth.   Vitamin D3 125 MCG (5000 UT) Tabs Take by mouth daily.        Birth History: non-contributory  Developmental History: non-contributory  Past Surgical History: Past Surgical History:  Procedure Laterality Date   ABDOMINAL HYSTERECTOMY     arm reduction Bilateral 2021   CARPAL TUNNEL  RELEASE     ROUX-EN-Y PROCEDURE  2015     Family History: Family History  Problem Relation Age of Onset   Hypertension Maternal Aunt    Alcoholism Maternal Grandmother    Cancer Maternal Grandfather        lung   Thyroid disease Neg Hx    Colon cancer Neg Hx    Colon polyps Neg Hx    Allergic rhinitis Neg Hx    Angioedema Neg Hx    Asthma Neg Hx    Eczema Neg Hx    Urticaria Neg Hx      Social History: Donalee lives at home with her husband. She does not have children. In a house that is just over a-year-old.  There is likely vinyl plank throughout the home.  They have gas heating and central cooling.  There are no animals inside or outside of the home.  There are no dust  mite covers on the bedding.  There is no tobacco exposure.  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  She has been there for 3 years.  There is dust exposure in the home.  There is no HEPA filter.  She does not live near an interstate or industrial area.  She is a never smoker.   Review of Systems  Constitutional: Negative.  Negative for chills, fever, malaise/fatigue and weight loss.  HENT:  Positive for congestion. Negative for ear discharge, ear pain and sinus pain.   Eyes:  Negative for pain, discharge and redness.  Respiratory:  Negative for cough, sputum production, shortness of breath and wheezing.   Cardiovascular: Negative.  Negative for chest pain and palpitations.  Gastrointestinal:  Negative for abdominal pain, constipation, diarrhea, heartburn, nausea and vomiting.  Skin:  Positive for itching and rash.  Neurological:  Negative for dizziness and headaches.  Endo/Heme/Allergies:  Positive for environmental allergies. Does not bruise/bleed easily.       Objective:   Blood pressure (!) 150/82, pulse 87, temperature 98.2 F (36.8 C), resp. rate 18, height 5\' 4"  (1.626 m), weight (!) 313 lb 2 oz (142 kg), SpO2 97 %. Body mass index is 53.75  kg/m.     Physical Exam Constitutional:      Appearance: She is well-developed.  HENT:     Head: Normocephalic and atraumatic.     Right Ear: Tympanic membrane, ear canal and external ear normal. No drainage, swelling or tenderness. Tympanic membrane is not injected, scarred, erythematous, retracted or bulging.     Left Ear: Tympanic membrane, ear canal and external ear normal. No drainage, swelling or tenderness. Tympanic membrane is not injected, scarred, erythematous, retracted or bulging.     Nose: No nasal deformity, septal deviation, mucosal edema or rhinorrhea.     Right Sinus: No maxillary sinus tenderness or frontal sinus tenderness.     Left Sinus: No maxillary sinus tenderness or frontal sinus tenderness.     Mouth/Throat:     Mouth: Mucous membranes are not pale and not dry.     Pharynx: Uvula midline.  Eyes:     General:        Right eye: No discharge.        Left eye: No discharge.     Conjunctiva/sclera: Conjunctivae normal.     Right eye: Right conjunctiva is not injected. No chemosis.    Left eye: Left conjunctiva is not injected. No chemosis.    Pupils: Pupils are equal, round, and reactive to light.  Cardiovascular:     Rate and Rhythm: Normal rate and regular rhythm.     Heart sounds: Normal heart sounds.  Pulmonary:     Effort: Pulmonary effort is normal. No tachypnea, accessory muscle usage or respiratory distress.     Breath sounds: Normal breath sounds. No wheezing, rhonchi or rales.  Chest:     Chest wall: No tenderness.  Abdominal:     Tenderness: There is no abdominal tenderness. There is no guarding or rebound.  Lymphadenopathy:     Head:     Right side of head: No submandibular, tonsillar or occipital adenopathy.     Left side of head: No submandibular, tonsillar or occipital adenopathy.     Cervical: No cervical adenopathy.  Skin:    General: Skin is warm.     Capillary Refill: Capillary refill takes less than 2 seconds.     Coloration: Skin  is not pale.  Findings: No abrasion, erythema, petechiae or rash. Rash is not papular, urticarial or vesicular.     Comments: Marked dermatographia.  Neurological:     Mental Status: She is alert.      Diagnostic studies: deferred due to insurance stipulations that refuse to pay for testing at initial visits, making it more difficult for patients to get the care they need        Malachi Bonds, MD [facility]

## 2022-09-17 ENCOUNTER — Ambulatory Visit (INDEPENDENT_AMBULATORY_CARE_PROVIDER_SITE_OTHER): Payer: Commercial Managed Care - PPO

## 2022-09-17 DIAGNOSIS — R1031 Right lower quadrant pain: Secondary | ICD-10-CM | POA: Diagnosis not present

## 2022-09-17 NOTE — Progress Notes (Signed)
PELVIC US TA/TV: normal vaginal cuff,3.1 x 1.8 x 2.4 cm avascular hemorrhagic right ovarian cysts,left ovary appears to be WNL,limited view of left ovary,no free fluid,right ovary appears to slide,no pain during ultrasound  Chaperone Derrill Kay

## 2022-09-18 ENCOUNTER — Other Ambulatory Visit: Payer: Self-pay

## 2022-09-18 ENCOUNTER — Ambulatory Visit (INDEPENDENT_AMBULATORY_CARE_PROVIDER_SITE_OTHER): Payer: Commercial Managed Care - PPO | Admitting: Family Medicine

## 2022-09-18 ENCOUNTER — Encounter: Payer: Self-pay | Admitting: Family Medicine

## 2022-09-18 VITALS — BP 160/98 | HR 70 | Temp 98.0°F | Resp 18

## 2022-09-18 DIAGNOSIS — J310 Chronic rhinitis: Secondary | ICD-10-CM

## 2022-09-18 DIAGNOSIS — L501 Idiopathic urticaria: Secondary | ICD-10-CM

## 2022-09-18 MED ORDER — CETIRIZINE HCL 10 MG PO TABS
10.0000 mg | ORAL_TABLET | Freq: Two times a day (BID) | ORAL | 1 refills | Status: DC
Start: 2022-09-18 — End: 2022-12-04

## 2022-09-18 MED ORDER — FAMOTIDINE 20 MG PO TABS: 20.0000 mg | ORAL_TABLET | Freq: Two times a day (BID) | ORAL | 1 refills | Status: DC

## 2022-09-18 NOTE — Progress Notes (Signed)
[address] [place] [address] Dept: [phone]  FOLLOW UP NOTE  Patient ID: Jacqueline Parsons, female    DOB: 02/16/1971  Age: 52 y.o. MRN: [id number] Date of Office Visit: 09/18/2022  Assessment  Chief Complaint: Allergy Testing  HPI Ivanell Marcinko is a 52 year old female who presents to the clinic for follow-up visit.  She was last seen in this clinic on 09/16/2022 by Dr. Dellis Anes as a new patient for evaluation of chronic rhinitis and urticaria.  She returns today for allergy skin testing as her insurance does not allow any skin testing on the initial visit.  At today's visit, she reports her allergic rhinitis has been moderately well-controlled.  She continues montelukast 10 mg once a day, however, has not had any antihistamines over the last 3 days.  She reports that she is feeling well overall today with no cardiopulmonary, gastrointestinal, or integumentary symptoms.  Her current medications are listed in the chart.   Drug Allergies:  No Known Allergies  Physical Exam: BP (!) 160/98 (BP Location: Left Arm, Patient Position: Sitting, Cuff Size: Large)   Pulse 70   Temp 98 F (36.7 C) (Temporal)   Resp 18   SpO2 94%    Physical Exam Vitals reviewed.  Constitutional:      Appearance: Normal appearance.  HENT:     Head: Normocephalic and atraumatic.     Nose:     Comments: Bilateral naris slightly erythematous with clear nasal drainage noted.  Pharynx normal.  Ears normal.  Eyes normal.    Mouth/Throat:     Pharynx: Oropharynx is clear.  Eyes:     Conjunctiva/sclera: Conjunctivae normal.  Cardiovascular:     Rate and Rhythm: Normal rate and regular rhythm.     Heart sounds: Normal heart sounds. No murmur heard. Pulmonary:     Effort: Pulmonary effort is normal.     Breath sounds: Normal breath sounds.     Comments: Lungs clear to auscultation Musculoskeletal:        General: Normal range of motion.     Cervical back: Normal range of motion  and neck supple.  Skin:    General: Skin is warm and dry.  Neurological:     Mental Status: She is alert and oriented to person, place, and time.  Psychiatric:        Mood and Affect: Mood normal.        Behavior: Behavior normal.        Thought Content: Thought content normal.        Judgment: Judgment normal.     Diagnostics: Percutaneous environmental allergy skin testing was negative to the adult environmental panel with adequate controls.  We will move forward with collecting blood work for allergic rhinitis and papular urticaria.  Assessment and Plan: 1. Chronic rhinitis   2. Idiopathic urticaria     Meds ordered this encounter  Medications   cetirizine (ZYRTEC) 10 MG tablet    Sig: Take 1 tablet (10 mg total) by mouth 2 (two) times daily.    Dispense:  180 tablet    Refill:  1   famotidine (PEPCID) 20 MG tablet    Sig: Take 1 tablet (20 mg total) by mouth 2 (two) times daily.    Dispense:  180 tablet    Refill:  1    Patient Instructions  Rhinitis Continue montelukast 10 mg once a day to prevent nasal symptoms Continue an antihistamine once a day as needed for runny nose or itch.  Remember to rotate to a different antihistamine about every 3 months. Some examples of over the counter antihistamines include Zyrtec (cetirizine), Xyzal (levocetirizine), Allegra (fexofenadine), and Claritin (loratidine).  Continue Flonase 2 sprays in each nostril once a day as needed for stuffy nose Consider saline nasal rinses as needed for nasal symptoms. Use this before any medicated nasal sprays for best result A lab test has been ordered to help [place] evaluate your environmental allergies.  We will call you with the results when they become available  Hives (urticaria) Use the least amount of medications while remaining hive and itch free Cetirizine (Zyrtec) 10mg  twice a day and famotidine (Pepcid) 20 mg twice a day. If no symptoms for 7-14 days then decrease to. Cetirizine (Zyrtec)  10mg  twice a day and famotidine (Pepcid) 20 mg once a day.  If no symptoms for 7-14 days then decrease to. Cetirizine (Zyrtec) 10mg  twice a day.  If no symptoms for 7-14 days then decrease to. Cetirizine (Zyrtec) 10mg  once a day.  May use Benadryl (diphenhydramine) as needed for breakthrough hives       If symptoms return, then step up dosage  Keep a detailed symptom journal including foods eaten, contact with allergens, medications taken, weather changes.  A lab test has been ordered to help [place] evaluate your hives.  We will call you when the results become available  Call the clinic if this treatment plan is not working well for you  Follow up in 2 months or sooner if needed.   Return in about 2 months (around 11/18/2022), or if symptoms worsen or fail to improve.    Thank you for the opportunity to care for this patient.  Please do not hesitate to contact me with questions.  Thermon Leyland, FNP [facility]

## 2022-09-18 NOTE — Patient Instructions (Addendum)
Rhinitis Continue montelukast 10 mg once a day to prevent nasal symptoms Continue an antihistamine once a day as needed for runny nose or itch. Remember to rotate to a different antihistamine about every 3 months. Some examples of over the counter antihistamines include Zyrtec (cetirizine), Xyzal (levocetirizine), Allegra (fexofenadine), and Claritin (loratidine).  Continue Flonase 2 sprays in each nostril once a day as needed for stuffy nose Consider saline nasal rinses as needed for nasal symptoms. Use this before any medicated nasal sprays for best result A lab test has been ordered to help [place] evaluate your environmental allergies.  We will call you with the results when they become available  Hives (urticaria) Use the least amount of medications while remaining hive and itch free Cetirizine (Zyrtec) 10mg  twice a day and famotidine (Pepcid) 20 mg twice a day. If no symptoms for 7-14 days then decrease to. Cetirizine (Zyrtec) 10mg  twice a day and famotidine (Pepcid) 20 mg once a day.  If no symptoms for 7-14 days then decrease to. Cetirizine (Zyrtec) 10mg  twice a day.  If no symptoms for 7-14 days then decrease to. Cetirizine (Zyrtec) 10mg  once a day.  May use Benadryl (diphenhydramine) as needed for breakthrough hives       If symptoms return, then step up dosage  Keep a detailed symptom journal including foods eaten, contact with allergens, medications taken, weather changes.  A lab test has been ordered to help [place] evaluate your hives.  We will call you when the results become available  Call the clinic if this treatment plan is not working well for you  Follow up in 2 months or sooner if needed.

## 2022-09-25 LAB — ALLERGENS, ZONE 2
Alternaria Alternata IgE: <0.1 kU/L
Amer Sycamore IgE Qn: <0.1 kU/L
Aspergillus Fumigatus IgE: <0.1 kU/L
Bahia Grass IgE: <0.1 kU/L
Bermuda Grass IgE: <0.1 kU/L
Cat Dander IgE: <0.1 kU/L
Cedar, Mountain IgE: <0.1 kU/L
Cladosporium Herbarum IgE: <0.1 kU/L
Cockroach, American IgE: <0.1 kU/L
Common Silver Birch IgE: <0.1 kU/L
D Farinae IgE: 0.26 kU/L — AB
D Pteronyssinus IgE: 0.33 kU/L — AB
Dog Dander IgE: <0.1 kU/L
Elm, American IgE: <0.1 kU/L
Hickory, White IgE: <0.1 kU/L
Johnson Grass IgE: <0.1 kU/L
Maple/Box Elder IgE: <0.1 kU/L
Mucor Racemosus IgE: <0.1 kU/L
Mugwort IgE Qn: <0.1 kU/L
Nettle IgE: <0.1 kU/L
Oak, White IgE: <0.1 kU/L
Penicillium Chrysogen IgE: <0.1 kU/L
Pigweed, Rough IgE: <0.1 kU/L
Plantain, English IgE: <0.1 kU/L
Ragweed, Short IgE: <0.1 kU/L
Sheep Sorrel IgE Qn: <0.1 kU/L
Stemphylium Herbarum IgE: <0.1 kU/L
Sweet gum IgE RAST Ql: <0.1 kU/L
Timothy Grass IgE: <0.1 kU/L
White Mulberry IgE: <0.1 kU/L

## 2022-09-25 LAB — ALPHA-GAL PANEL
Allergen Lamb IgE: <0.1 kU/L
Beef IgE: <0.1 kU/L
IgE (Immunoglobulin E), Serum: 141 IU/mL (ref 6–495)
O215-IgE Alpha-Gal: <0.1 kU/L
Pork IgE: <0.1 kU/L

## 2022-09-25 LAB — THYROID PEROXIDASE ANTIBODY: Thyroperoxidase Ab SerPl-aCnc: 12 IU/mL (ref 0–34)

## 2022-09-25 LAB — TRYPTASE: Tryptase: 6.6 ug/L (ref 2.2–13.2)

## 2022-09-25 LAB — THYROGLOBULIN ANTIBODY: Thyroglobulin Antibody: <1 IU/mL (ref 0.0–0.9)

## 2022-09-25 LAB — CHRONIC URTICARIA: cu index: 2.5 (ref <10)

## 2022-09-25 NOTE — Progress Notes (Signed)
Can you please let this patient know that her environmental allergy testing was positive to dust mites and negative to the remainder of the panel. Alpha gal panel was negative so no red meat allergy. Thyroid antibodies and chronic urticaria panels were all normal. And tryptase was normal. Can you please send out avoidance measures for dust mites and have her follow up with Dr. Dellis Anes in about 1-2 months? Thank you

## 2022-09-30 ENCOUNTER — Ambulatory Visit: Payer: Self-pay | Admitting: Family

## 2022-10-19 ENCOUNTER — Encounter: Payer: Self-pay | Admitting: Internal Medicine

## 2022-10-19 ENCOUNTER — Ambulatory Visit (INDEPENDENT_AMBULATORY_CARE_PROVIDER_SITE_OTHER): Payer: Commercial Managed Care - PPO | Admitting: Internal Medicine

## 2022-10-19 VITALS — BP 154/83 | HR 77 | Ht 64.0 in | Wt 318.0 lb

## 2022-10-19 DIAGNOSIS — E039 Hypothyroidism, unspecified: Secondary | ICD-10-CM | POA: Diagnosis not present

## 2022-10-19 DIAGNOSIS — I10 Essential (primary) hypertension: Secondary | ICD-10-CM | POA: Diagnosis not present

## 2022-10-19 DIAGNOSIS — E7800 Pure hypercholesterolemia, unspecified: Secondary | ICD-10-CM | POA: Diagnosis not present

## 2022-10-19 NOTE — Patient Instructions (Signed)
It was a pleasure to see you today.  Thank you for giving [place] the opportunity to be involved in your care.  Below is a brief recap of your visit and next steps.  We will plan to see you again in 4 weeks.  Summary You have established care today Please send [place] your most recent lab results We will plan for follow up in 4 weeks to discuss weight loss and to check your blood pressure In the interim, please contact your insurance company to see if they will cover Zepbound or Wegovy.

## 2022-10-19 NOTE — Assessment & Plan Note (Signed)
BMI 54.5.  Her acute concern today is wanting to start medication for weight loss.  She states that she has recently made significant lifestyle modifications aimed at weight loss but has not seen any results.  She is specifically interested in starting Zepbound.  -Less than modifications and weight loss were again reviewed today -Recent labs have been requested -We will tentatively plan for follow-up in 4 weeks to discuss starting medication for weight loss.  In the interim, I have requested that she contact her insurance company to see if they will cover Wegovy or Zepbound.

## 2022-10-19 NOTE — Assessment & Plan Note (Signed)
Previous documented history of hyperlipidemia.  She is currently prescribed pravastatin 40 mg daily.  Lipid panel at goal when updated in August.  She reports that labs were also updated 1 month ago but are not available for review. -No medication changes today.  Recent labs have been requested.

## 2022-10-19 NOTE — Assessment & Plan Note (Signed)
Previously documented history of hypothyroidism.  She is currently prescribed levothyroxine 88 mcg daily.  Asymptomatic currently. -No medication changes today.  Reports that TFTs were recently updated.  Recent labs requested.

## 2022-10-19 NOTE — Progress Notes (Signed)
New Patient Office Visit  Subjective    Patient ID: Jacqueline Parsons, female    DOB: 14-Dec-1970  Age: 52 y.o. MRN: [id number]  CC:  Chief Complaint  Patient presents with   Establish Care    Wants to get weigh loss injections. Been dieting and exercising and not losing.   HPI Jacqueline Parsons presents to establish care.  She is a 52 year old woman who endorses a past medical history significant for HTN, HLD, hypothyroidism, Roux-en-Y gastric bypass surgery (2011), and cervical cancer s/p hysterectomy (2002).  She was previously followed by Dr. Wyline Mood at [facility] family [facility] in [place].  Jacqueline Parsons reports doing well today.  Her acute concern is wanting to discuss medication options for weight loss.  She reports making significant lifestyle modifications aimed at weight loss but has not seen any results.  She denies tobacco, alcohol, and illicit drug use.  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  Her family medical history is significant for prostate cancer, ovarian cancer, and hypertension.  Acute concerns, chronic medical conditions, and outstanding preventative care items discussed today are individually addressing/below.  Outpatient Encounter Medications as of 10/19/2022  Medication Sig   Ascorbic Acid (VITAMIN C PO) Take by mouth.   calcium gluconate 500 MG tablet Take 1 tablet by mouth daily.   cetirizine (ZYRTEC) 10 MG tablet Take 1 tablet (10 mg total) by mouth 2 (two) times daily.   Cholecalciferol (VITAMIN D3) 5000 units TABS Take by mouth daily.    diltiazem (DILACOR XR) 240 MG 24 hr capsule Take 240 mg by mouth daily.   diphenhydrAMINE HCl (BENADRYL PO) Take by mouth.   famotidine (PEPCID) 20 MG tablet Take 1 tablet (20 mg total) by mouth 2 (two) times daily.   fluticasone (FLONASE) 50 MCG/ACT nasal spray Place 1 spray into both nostrils daily.   levothyroxine (SYNTHROID, LEVOTHROID) 88 MCG tablet Take 88 mcg by mouth daily.    lisinopril (PRINIVIL,ZESTRIL) 20 MG tablet Take 20 mg by mouth daily.    Loratadine (CLARITIN PO) Take by mouth.   montelukast (SINGULAIR) 10 MG tablet Take 1 tablet (10 mg total) by mouth at bedtime.   pediatric multivitamin-iron (POLY-VI-SOL WITH IRON) 15 MG chewable tablet Chew 1 tablet by mouth daily.   pravastatin (PRAVACHOL) 40 MG tablet Take 40 mg by mouth daily.   valACYclovir (VALTREX) 500 MG tablet as needed.   vitamin B-12 (CYANOCOBALAMIN) 500 MCG tablet Take by mouth.   No facility-administered encounter medications on file as of 10/19/2022.    Past Medical History:  Diagnosis Date   Allergy 1996   Anxiety    Cervical cancer (HCC) 2002   Depression    Gastric bypass status for obesity 2015   Hypertension    Oxygen deficiency    Sleep apnea 2007   Thyroid disease    Vaginal Pap smear, abnormal     Past Surgical History:  Procedure Laterality Date   ABDOMINAL HYSTERECTOMY     arm reduction Bilateral 2021   CARPAL TUNNEL RELEASE     ROUX-EN-Y PROCEDURE  2015    Family History  Problem Relation Age of Onset   Alcohol abuse Mother    Alcohol abuse Father    Hypertension Maternal Aunt    Alcoholism Maternal Grandmother    Cancer Maternal Grandfather        lung   Thyroid disease Neg Hx    Colon cancer Neg Hx    Colon polyps Neg Hx  Allergic rhinitis Neg Hx    Angioedema Neg Hx    Asthma Neg Hx    Eczema Neg Hx    Urticaria Neg Hx     Social History   Socioeconomic History   Marital status: Married    Spouse name: Not on file   Number of children: Not on file   Years of education: Not on file   Highest education level: Not on file  Occupational History   Occupation: [employer]  Tobacco Use   Smoking status: Never   Smokeless tobacco: Never  Vaping Use   Vaping Use: Never used  Substance and Sexual Activity   Alcohol use: Yes    Comment: occ   Drug use: No   Sexual activity: Yes    Birth control/protection: None    Comment: hyst  Other Topics  Concern   Not on file  Social History Narrative   SINGLE-ENGAGED. NO KIDS. WORKS AS A TAX COLLECTOR. SPENDS FREE TIME: CRAFTING.   Social Determinants of Health   Financial Resource Strain: Low Risk  (09/22/2021)   Overall Financial Resource Strain (CARDIA)    Difficulty of Paying Living Expenses: Not hard at all  Food Insecurity: No Food Insecurity (09/22/2021)   Hunger Vital Sign    Worried About Running Out of Food in the Last Year: Never true    Ran Out of Food in the Last Year: Never true  Transportation Needs: No Transportation Needs (09/22/2021)   PRAPARE - Administrator, Civil Service (Medical): No    Lack of Transportation (Non-Medical): No  Physical Activity: Inactive (09/22/2021)   Exercise Vital Sign    Days of Exercise per Week: 0 days    Minutes of Exercise per Session: 0 min  Stress: Stress Concern Present (09/22/2021)   [employer] of Occupational Health - Occupational Stress Questionnaire    Feeling of Stress : Very much  Social Connections: Socially Integrated (09/22/2021)   Social Connection and Isolation Panel [NHANES]    Frequency of Communication with Friends and Family: More than three times a week    Frequency of Social Gatherings with Friends and Family: Once a week    Attends Religious Services: More than 4 times per year    Active Member of [employer] or Organizations: Yes    Attends Banker Meetings: Never    Marital Status: Married  Catering manager Violence: Not At Risk (09/22/2021)   Humiliation, Afraid, Rape, and Kick questionnaire    Fear of Current or Ex-Partner: No    Emotionally Abused: No    Physically Abused: No    Sexually Abused: No   Review of Systems  Constitutional:  Negative for chills and fever.  HENT:  Negative for sore throat.   Respiratory:  Negative for cough and shortness of breath.   Cardiovascular:  Negative for chest pain, palpitations and leg swelling.  Gastrointestinal:  Negative for abdominal pain, blood  in stool, constipation, diarrhea, nausea and vomiting.  Genitourinary:  Negative for dysuria and hematuria.  Musculoskeletal:  Negative for myalgias.  Skin:  Negative for itching and rash.  Neurological:  Negative for dizziness and headaches.  Psychiatric/Behavioral:  Negative for depression and suicidal ideas.    Objective    BP (!) 154/83   Pulse 77   Ht 5\' 4"  (1.626 m)   Wt (!) 318 lb (144.2 kg)   SpO2 97%   BMI 54.58 kg/m   Physical Exam Vitals reviewed.  Constitutional:      General:  She is not in acute distress.    Appearance: Normal appearance. She is obese. She is not toxic-appearing.  HENT:     Head: Normocephalic and atraumatic.     Right Ear: External ear normal.     Left Ear: External ear normal.     Nose: Nose normal. No congestion or rhinorrhea.     Mouth/Throat:     Mouth: Mucous membranes are moist.     Pharynx: Oropharynx is clear. No oropharyngeal exudate or posterior oropharyngeal erythema.  Eyes:     General: No scleral icterus.    Extraocular Movements: Extraocular movements intact.     Conjunctiva/sclera: Conjunctivae normal.     Pupils: Pupils are equal, round, and reactive to light.  Cardiovascular:     Rate and Rhythm: Normal rate and regular rhythm.     Pulses: Normal pulses.     Heart sounds: Normal heart sounds. No murmur heard.    No friction rub. No gallop.  Pulmonary:     Effort: Pulmonary effort is normal.     Breath sounds: Normal breath sounds. No wheezing, rhonchi or rales.  Abdominal:     General: Abdomen is flat. Bowel sounds are normal. There is no distension.     Palpations: Abdomen is soft.     Tenderness: There is no abdominal tenderness.  Musculoskeletal:        General: No swelling. Normal range of motion.     Cervical back: Normal range of motion.     Right lower leg: No edema.     Left lower leg: No edema.  Lymphadenopathy:     Cervical: No cervical adenopathy.  Skin:    General: Skin is warm and dry.     Capillary  Refill: Capillary refill takes less than 2 seconds.     Coloration: Skin is not jaundiced.  Neurological:     General: No focal deficit present.     Mental Status: She is alert and oriented to person, place, and time.  Psychiatric:        Mood and Affect: Mood normal.        Behavior: Behavior normal.    Assessment & Plan:   Problem List Items Addressed This Visit       Essential hypertension - Primary    Previously documented history of essential hypertension.  She is currently prescribed lisinopril 20 mg twice daily and diltiazem 240 mg daily.  BP is elevated today, 166/108 initially and 154/83 on repeat.  She reports taking her medications prior to today's appointment. -No medication changes today.  We will tentatively plan for follow-up in 4 weeks for HTN check.      Adult hypothyroidism    Previously documented history of hypothyroidism.  She is currently prescribed levothyroxine 88 mcg daily.  Asymptomatic currently. -No medication changes today.  Reports that TFTs were recently updated.  Recent labs requested.      Hyperlipidemia    Previous documented history of hyperlipidemia.  She is currently prescribed pravastatin 40 mg daily.  Lipid panel at goal when updated in August.  She reports that labs were also updated 1 month ago but are not available for review. -No medication changes today.  Recent labs have been requested.      Morbid obesity (HCC)    BMI 54.5.  Her acute concern today is wanting to start medication for weight loss.  She states that she has recently made significant lifestyle modifications aimed at weight loss but has not seen any results.  She is specifically interested in starting Zepbound.  -Less than modifications and weight loss were again reviewed today -Recent labs have been requested -We will tentatively plan for follow-up in 4 weeks to discuss starting medication for weight loss.  In the interim, I have requested that she contact her insurance  company to see if they will cover Wegovy or Zepbound.       Return in about 4 weeks (around 11/16/2022) for HTN, Weight Loss.   Billie Lade, MD

## 2022-10-19 NOTE — Assessment & Plan Note (Signed)
Previously documented history of essential hypertension.  She is currently prescribed lisinopril 20 mg twice daily and diltiazem 240 mg daily.  BP is elevated today, 166/108 initially and 154/83 on repeat.  She reports taking her medications prior to today's appointment. -No medication changes today.  We will tentatively plan for follow-up in 4 weeks for HTN check.

## 2022-11-02 ENCOUNTER — Encounter: Payer: Self-pay | Admitting: Internal Medicine

## 2022-11-02 ENCOUNTER — Other Ambulatory Visit: Payer: Self-pay

## 2022-11-02 MED ORDER — WEGOVY 0.25 MG/0.5ML ~~LOC~~ SOAJ: 0.2500 mg | SUBCUTANEOUS | 0 refills | Status: DC

## 2022-11-11 ENCOUNTER — Ambulatory Visit: Payer: Commercial Managed Care - PPO | Admitting: Internal Medicine

## 2022-11-16 ENCOUNTER — Ambulatory Visit: Payer: Commercial Managed Care - PPO | Admitting: Internal Medicine

## 2022-11-17 ENCOUNTER — Other Ambulatory Visit: Payer: Self-pay

## 2022-11-20 ENCOUNTER — Ambulatory Visit (INDEPENDENT_AMBULATORY_CARE_PROVIDER_SITE_OTHER): Payer: Commercial Managed Care - PPO | Admitting: Internal Medicine

## 2022-11-20 ENCOUNTER — Ambulatory Visit: Payer: Self-pay

## 2022-11-20 ENCOUNTER — Telehealth: Payer: Self-pay | Admitting: Orthopedic Surgery

## 2022-11-20 ENCOUNTER — Encounter: Payer: Self-pay | Admitting: Orthopedic Surgery

## 2022-11-20 ENCOUNTER — Ambulatory Visit (INDEPENDENT_AMBULATORY_CARE_PROVIDER_SITE_OTHER): Payer: Commercial Managed Care - PPO | Admitting: Orthopedic Surgery

## 2022-11-20 ENCOUNTER — Encounter: Payer: Self-pay | Admitting: Internal Medicine

## 2022-11-20 VITALS — BP 156/97 | HR 90 | Ht 64.0 in | Wt 321.0 lb

## 2022-11-20 DIAGNOSIS — M23321 Other meniscus derangements, posterior horn of medial meniscus, right knee: Secondary | ICD-10-CM

## 2022-11-20 DIAGNOSIS — M25561 Pain in right knee: Secondary | ICD-10-CM

## 2022-11-20 DIAGNOSIS — I10 Essential (primary) hypertension: Secondary | ICD-10-CM

## 2022-11-20 DIAGNOSIS — R053 Chronic cough: Secondary | ICD-10-CM | POA: Diagnosis not present

## 2022-11-20 MED ORDER — FLUTICASONE PROPIONATE 50 MCG/ACT NA SUSP: 1.0000 | Freq: Every day | NASAL | 3 refills | Status: DC

## 2022-11-20 MED ORDER — ZEPBOUND 2.5 MG/0.5ML ~~LOC~~ SOAJ
2.5000 mg | SUBCUTANEOUS | 0 refills | Status: DC
Start: 2022-11-20 — End: 2022-12-18

## 2022-11-20 MED ORDER — BENZONATATE 100 MG PO CAPS: 100.0000 mg | ORAL_CAPSULE | Freq: Three times a day (TID) | ORAL | 0 refills | Status: DC | PRN

## 2022-11-20 MED ORDER — CHLORTHALIDONE 25 MG PO TABS
25.0000 mg | ORAL_TABLET | Freq: Every day | ORAL | 2 refills | Status: DC
Start: 2022-11-20 — End: 2022-11-23

## 2022-11-20 MED ORDER — ALBUTEROL SULFATE HFA 108 (90 BASE) MCG/ACT IN AERS: 2.0000 | INHALATION_SPRAY | Freq: Four times a day (QID) | RESPIRATORY_TRACT | 0 refills | Status: DC | PRN

## 2022-11-20 MED ORDER — LISINOPRIL 40 MG PO TABS
40.0000 mg | ORAL_TABLET | Freq: Every day | ORAL | 3 refills | Status: DC
Start: 2022-11-20 — End: 2023-09-03

## 2022-11-20 NOTE — Addendum Note (Signed)
Addended byCaffie Damme on: 11/20/2022 08:56 AM   Modules accepted: Orders

## 2022-11-20 NOTE — Patient Instructions (Signed)
It was a pleasure to see you today.  Thank you for giving [place] the opportunity to be involved in your care.  Below is a brief recap of your visit and next steps.  We will plan to see you again in 3 months.   Summary Add chlorthalidone for hypertension Start Zepbound for weight loss Albuterol inhaler refilled and tessalon perles added for cough relief Follow up in 3 months with me, 2 weeks for nurse visit (BP check)

## 2022-11-20 NOTE — Telephone Encounter (Signed)
Dr. Mort Sawyers pt - spoke w/Mel at the pre-service center [phone] ext [phone], pt's MRI needs a prior auth.

## 2022-11-20 NOTE — Assessment & Plan Note (Signed)
BMI 55.1.  She remains interested in starting medication for weight loss.  She has spoken with her insurance company and both [place] and [place] are covered.  Reginal Lutes was initially prescribed, however Ms. Jacqueline Parsons's preference Zepbound. -Start Zepbound 2.5 mg weekly -Follow-up in 3 months for reassessment

## 2022-11-20 NOTE — Patient Instructions (Addendum)
Use ice 3 times a day for 20 to 30 minutes  Use topical medication such as Bengay or Aspercreme twice a day as well  Take ibuprofen 400 mg every 8 hours as needed for pain and Tylenol extra strength 500 mg every 6 hours as needed for pain  Avoid activities that cause knee pain  MRI is ordered to check for meniscus tear  While we are working on your approval for MRI please go ahead and call to schedule your appointment with [facility] within at least one (1) week.   Central Scheduling [phone]

## 2022-11-20 NOTE — Progress Notes (Signed)
Established Patient Office Visit  Subjective   Patient ID: Jacqueline Parsons, female    DOB: [birth year]  Age: 52 y.o. MRN: [id number]  Chief Complaint  Patient presents with   Hypertension    Follow up   Weight Loss    Follow up   Jacqueline Parsons returns to care today for HTN follow-up and weight loss management.  She was last evaluated by me on 6/3 as a new patient presenting to establish care.  Her blood pressure was elevated at that time.  No medication changes were made and 4-week follow-up was arranged for HTN check.  She also expressed an interest in starting medication for weight loss.  There have been no acute interval events.  Jacqueline Parsons reports feeling fairly well today.  She recently fell on her left knee and endorses right knee discomfort.  She was evaluated by orthopedic surgery earlier this morning and will undergo MRI.  She additionally endorses a persistent cough today and requests medication recommendations.  Past Medical History:  Diagnosis Date   Allergy 1996   Anxiety    Cervical cancer (HCC) 2002   Depression    Gastric bypass status for obesity 2015   Hypertension    Oxygen deficiency    Sleep apnea 2007   Thyroid disease    Vaginal Pap smear, abnormal    Past Surgical History:  Procedure Laterality Date   ABDOMINAL HYSTERECTOMY     arm reduction Bilateral 2021   CARPAL TUNNEL RELEASE     ROUX-EN-Y PROCEDURE  2015   Social History   Tobacco Use   Smoking status: Never   Smokeless tobacco: Never  Vaping Use   Vaping Use: Never used  Substance Use Topics   Alcohol use: Yes    Comment: occ   Drug use: No   Family History  Problem Relation Age of Onset   Alcohol abuse Mother    Alcohol abuse Father    Hypertension Maternal Aunt    Alcoholism Maternal Grandmother    Cancer Maternal Grandfather        lung   Thyroid disease Neg Hx    Colon cancer Neg Hx    Colon polyps Neg Hx    Allergic rhinitis Neg Hx    Angioedema Neg Hx    Asthma Neg Hx     Eczema Neg Hx    Urticaria Neg Hx    No Known Allergies    Review of Systems  Respiratory:  Positive for cough. Negative for sputum production.   Musculoskeletal:  Positive for joint pain (Right knee pain).  All other systems reviewed and are negative.    Objective:     BP (!) 141/76   Pulse 81   Ht 5\' 4"  (1.626 m)   Wt (!) 321 lb 3.2 oz (145.7 kg)   SpO2 97%   BMI 55.13 kg/m  BP Readings from Last 3 Encounters:  11/20/22 (!) 141/76  11/20/22 (!) 156/97  10/19/22 (!) 154/83   Physical Exam Vitals reviewed.  Constitutional:      General: She is not in acute distress.    Appearance: Normal appearance. She is obese. She is not toxic-appearing.  HENT:     Head: Normocephalic and atraumatic.     Right Ear: External ear normal.     Left Ear: External ear normal.     Nose: Nose normal. No congestion or rhinorrhea.     Mouth/Throat:     Mouth: Mucous membranes are moist.  Pharynx: Oropharynx is clear. No oropharyngeal exudate or posterior oropharyngeal erythema.  Eyes:     General: No scleral icterus.    Extraocular Movements: Extraocular movements intact.     Conjunctiva/sclera: Conjunctivae normal.     Pupils: Pupils are equal, round, and reactive to light.  Cardiovascular:     Rate and Rhythm: Normal rate and regular rhythm.     Pulses: Normal pulses.     Heart sounds: Normal heart sounds. No murmur heard.    No friction rub. No gallop.  Pulmonary:     Effort: Pulmonary effort is normal.     Breath sounds: Normal breath sounds. No wheezing, rhonchi or rales.  Abdominal:     General: Abdomen is flat. Bowel sounds are normal. There is no distension.     Palpations: Abdomen is soft.     Tenderness: There is no abdominal tenderness.  Musculoskeletal:        General: No swelling. Normal range of motion.     Cervical back: Normal range of motion.     Right lower leg: No edema.     Left lower leg: No edema.  Lymphadenopathy:     Cervical: No cervical adenopathy.   Skin:    General: Skin is warm and dry.     Capillary Refill: Capillary refill takes less than 2 seconds.     Coloration: Skin is not jaundiced.  Neurological:     General: No focal deficit present.     Mental Status: She is alert and oriented to person, place, and time.  Psychiatric:        Mood and Affect: Mood normal.        Behavior: Behavior normal.   Last CBC Lab Results  Component Value Date   WBC 5.2 07/19/2017   HGB 12.3 07/19/2017   HCT 39.9 07/19/2017   MCV 84.4 07/19/2017   MCH 26.0 07/19/2017   RDW 15.5 07/19/2017   PLT 225 07/19/2017   Last metabolic panel Lab Results  Component Value Date   GLUCOSE 92 07/19/2017   NA 139 07/19/2017   K 3.3 (L) 07/19/2017   CL 100 (L) 07/19/2017   CO2 29 07/19/2017   BUN 16 07/19/2017   CREATININE 0.78 07/19/2017   GFRNONAA >60 07/19/2017   CALCIUM 9.6 07/19/2017   PROT 7.2 07/19/2017   ALBUMIN 4.0 07/19/2017   BILITOT 0.1 (L) 07/19/2017   ALKPHOS 60 07/19/2017   AST 24 07/19/2017   ALT 20 07/19/2017   ANIONGAP 10 07/19/2017    The 10-year ASCVD risk score (Arnett DK, et al., 2019) is: 3.4%    Assessment & Plan:   Problem List Items Addressed This Visit       Essential hypertension    Returning to care today for HTN follow-up.  She is currently prescribed lisinopril 20 mg twice daily and diltiazem 240 mg daily.  Her blood pressure remains elevated today. -Add chlorthalidone 25 mg daily -Lisinopril 40 mg daily has been refilled -Continue diltiazem at current dose -We will arrange follow-up in 2 weeks for a nurse visit for BP check      Morbid obesity (HCC) - Primary    BMI 55.1.  She remains interested in starting medication for weight loss.  She has spoken with her insurance company and both [place] and [place] are covered.  Reginal Lutes was initially prescribed, however Jacqueline Parsons's preference Zepbound. -Start Zepbound 2.5 mg weekly -Follow-up in 3 months for reassessment      Persistent cough  Albuterol  refilled and Tessalon Perles added for cough relief.       Return in about 3 months (around 02/20/2023).    Billie Lade, MD

## 2022-11-20 NOTE — Assessment & Plan Note (Signed)
Albuterol refilled and Tessalon Perles added for cough relief.

## 2022-11-20 NOTE — Progress Notes (Signed)
Chief Complaint  Patient presents with   Knee Pain    Right/ missed step getting off bus and has had pain since 11/06/22    History 52 year old female missed a step getting off of the bus grabbed the hand rails the right knee twisted into valgus internal rotation and she has had pain for 2 weeks that is not improving there may be some swelling around the knee as well.  BP (!) 156/97   Pulse 90   Ht 5\' 4"  (1.626 m)   Wt (!) 321 lb (145.6 kg)   BMI 55.10 kg/m  Physical Exam  Right knee comes to full extension and full flexion.  The arc of motion has no pain until we get to terminal flexion.  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

## 2022-11-20 NOTE — Assessment & Plan Note (Signed)
Returning to care today for HTN follow-up.  She is currently prescribed lisinopril 20 mg twice daily and diltiazem 240 mg daily.  Her blood pressure remains elevated today. -Add chlorthalidone 25 mg daily -Lisinopril 40 mg daily has been refilled -Continue diltiazem at current dose -We will arrange follow-up in 2 weeks for a nurse visit for BP check

## 2022-11-23 ENCOUNTER — Encounter: Payer: Self-pay | Admitting: Internal Medicine

## 2022-11-23 ENCOUNTER — Other Ambulatory Visit: Payer: Self-pay

## 2022-11-23 DIAGNOSIS — I10 Essential (primary) hypertension: Secondary | ICD-10-CM

## 2022-11-23 MED ORDER — CHLORTHALIDONE 25 MG PO TABS
25.00 mg | ORAL_TABLET | Freq: Every day | ORAL | 2 refills | Status: AC
Start: 2022-11-23 — End: ?

## 2022-11-23 NOTE — Telephone Encounter (Signed)
I have submitted online, pending UMR.  Trans ID [id number]

## 2022-11-25 ENCOUNTER — Ambulatory Visit (HOSPITAL_COMMUNITY)
Admission: RE | Admit: 2022-11-25 | Discharge: 2022-11-25 | Disposition: A | Discharge status: Home / Self Care | Payer: Commercial Managed Care - PPO | Source: Ambulatory Visit | Attending: Orthopedic Surgery | Admitting: Orthopedic Surgery

## 2022-11-25 DIAGNOSIS — M23321 Other meniscus derangements, posterior horn of medial meniscus, right knee: Secondary | ICD-10-CM | POA: Insufficient documentation

## 2022-11-25 DIAGNOSIS — M25561 Pain in right knee: Secondary | ICD-10-CM | POA: Insufficient documentation

## 2022-11-25 NOTE — Telephone Encounter (Signed)
Pt called in pre auth was denied due to no medical diagnosis.   Zepbound (previous [employer])

## 2022-11-27 ENCOUNTER — Encounter: Payer: Self-pay | Admitting: Internal Medicine

## 2022-12-03 ENCOUNTER — Other Ambulatory Visit: Payer: Self-pay

## 2022-12-04 ENCOUNTER — Encounter: Payer: Self-pay | Admitting: Allergy & Immunology

## 2022-12-04 ENCOUNTER — Ambulatory Visit: Payer: Commercial Managed Care - PPO | Admitting: Allergy & Immunology

## 2022-12-04 ENCOUNTER — Encounter: Payer: Self-pay | Admitting: Orthopedic Surgery

## 2022-12-04 ENCOUNTER — Ambulatory Visit: Payer: Commercial Managed Care - PPO

## 2022-12-04 ENCOUNTER — Other Ambulatory Visit: Payer: Self-pay

## 2022-12-04 VITALS — BP 130/80 | HR 95 | Temp 97.8°F | Resp 18 | Ht 64.0 in | Wt 317.8 lb

## 2022-12-04 DIAGNOSIS — L501 Idiopathic urticaria: Secondary | ICD-10-CM

## 2022-12-04 DIAGNOSIS — J3089 Other allergic rhinitis: Secondary | ICD-10-CM

## 2022-12-04 MED ORDER — CETIRIZINE HCL 10 MG PO TABS: 10.0000 mg | ORAL_TABLET | Freq: Every day | ORAL | 3 refills | Status: AC

## 2022-12-04 MED ORDER — MONTELUKAST SODIUM 10 MG PO TABS: 10.0000 mg | ORAL_TABLET | Freq: Every day | ORAL | 3 refills | Status: DC

## 2022-12-04 MED ORDER — FAMOTIDINE 20 MG PO TABS: 20.0000 mg | ORAL_TABLET | Freq: Every day | ORAL | 3 refills | Status: DC

## 2022-12-04 NOTE — Patient Instructions (Addendum)
1. Perennial allergic rhinitis (dust mites) - Continue with the cetirizine 10mg  every night or so. - Continue with the montelukast 10mg  every night or so.   2. Idiopathic urticaria - Continue with the cetirizine 10mg  every night or so. - Continue with the famotidine 20mg  every night or so.  - Call us with problems.   3. Return in about 1 year (around 12/04/2023).    Please inform us of any Emergency Department visits, hospitalizations, or changes in symptoms. Call us before going to the ED for breathing or allergy symptoms since we might be able to fit you in for a sick visit. Feel free to contact us anytime with any questions, problems, or concerns.  It was a pleasure to see you again today!  Websites that have reliable patient information: 1. American Academy of Asthma, Allergy, and Immunology: www.aaaai.org 2. Food Allergy Research and Education (FARE): foodallergy.org 3. Mothers of Asthmatics: http://www.asthmacommunitynetwork.org 4. American College of Allergy, Asthma, and Immunology: www.acaai.org   COVID-19 Vaccine Information can be found at: [email or web address].[email or web address] For questions related to vaccine distribution or appointments, please email [email or web address] or call [phone].   We realize that you might be concerned about having an allergic reaction to the COVID19 vaccines. To help with that concern, WE ARE OFFERING THE COVID19 VACCINES IN OUR OFFICE! Ask the front desk for dates!     "Like" [place] on Facebook and Instagram for our latest updates!      A healthy democracy works best when [employer] participate! Make sure you are registered to vote! If you have moved or changed any of your contact information, you will need to get this updated before voting!  In some cases, you MAY be able to register to vote online: [email or web address]

## 2022-12-04 NOTE — Progress Notes (Unsigned)
FOLLOW UP  Date of Service/Encounter:  12/04/22   Assessment:   Perennial allergic rhinitis (dust mites)    Chronic urticaria  Plan/Recommendations:    There are no Patient Instructions on file for this visit.   Subjective:   Jacqueline Parsons is a 52 y.o. female presenting today for follow up of No chief complaint on file.   [name] has a history of the following: Patient Active Problem List   Diagnosis Date Noted   Persistent cough 11/20/2022   RLQ abdominal pain 09/08/2022   Perimenopause 09/22/2021   Sleep disturbance 09/22/2021   Moody 09/22/2021   Night sweats 09/22/2021   Hot flashes 09/22/2021   Encounter for annual routine gynecological examination 09/22/2021   Anxiety and depression 09/22/2021   S/P hysterectomy 09/22/2021   Encounter for screening fecal occult blood testing 09/18/2020   Encounter for well woman exam with routine gynecological exam 09/18/2020   Thyroid disease 03/09/2019   History of cervical cancer 02/13/2019   Screening for colorectal cancer 02/13/2019   Vaginal Pap smear following hysterectomy for malignancy 02/13/2019   Dysphagia, idiopathic 01/04/2019   Swelling of right lower extremity 10/29/2017   Essential hypertension 10/29/2017   Hyperlipidemia 10/29/2017   Thyroid nodule 10/13/2017   Adult hypothyroidism 10/13/2017   Chronic edema 11/24/2016   Metatarsalgia of right foot 11/24/2016   Stress reaction of bone 11/24/2016   Morbid obesity (HCC) 2015    History obtained from: chart review and {Persons; PED relatives w/patient:19415::"patient"}.  [name] is a 52 y.o. female presenting for {Blank single:19197::"a food challenge","a drug challenge","skin testing","a sick visit","an evaluation of ***","a follow up visit"}. She had testing done  with Thurston Hole   He had labs done in May 2024 that showed positivity to dust mites as well as a negative alpha gal panel, negative thyroid antibodies, and negative chronic urticaria  panel.  Tryptase was also normal.  Since hte last visit, she has done well. She is doing montelukast at night. She is doing cetirizine every other night. She is doing the famotidine every other night as well.   {Blank single:19197::"Asthma/Respiratory Symptom History: ***"," "}  {Blank single:19197::"Allergic Rhinitis Symptom History: ***"," "}  {Blank single:19197::"Food Allergy Symptom History: ***"," "}  {Blank single:19197::"Skin Symptom History: ***"," "}  {Blank single:19197::"GERD Symptom History: ***"," "}  Otherwise, there have been no changes to her past medical history, surgical history, family history, or social history.    Review of systems otherwise negative other than that mentioned in the HPI.    Objective:   There were no vitals taken for this visit. There is no height or weight on file to calculate BMI.    Physical Exam   Diagnostic studies: {Blank single:19197::"none","deferred due to recent antihistamine use","labs sent instead"," "}  Spirometry: {Blank single:19197::"results normal (FEV1: ***%, FVC: ***%, FEV1/FVC: ***%)","results abnormal (FEV1: ***%, FVC: ***%, FEV1/FVC: ***%)"}.    {Blank single:19197::"Spirometry consistent with mild obstructive disease","Spirometry consistent with moderate obstructive disease","Spirometry consistent with severe obstructive disease","Spirometry consistent with possible restrictive disease","Spirometry consistent with mixed obstructive and restrictive disease","Spirometry uninterpretable due to technique","Spirometry consistent with normal pattern"}. {Blank single:19197::"Albuterol/Atrovent nebulizer","Xopenex/Atrovent nebulizer","Albuterol nebulizer","Albuterol four puffs via MDI","Xopenex four puffs via MDI"} treatment given in clinic with {Blank single:19197::"significant improvement in FEV1 per ATS criteria","significant improvement in FVC per ATS criteria","significant improvement in FEV1 and FVC per ATS  criteria","improvement in FEV1, but not significant per ATS criteria","improvement in FVC, but not significant per ATS criteria","improvement in FEV1 and FVC, but not significant per ATS criteria","no improvement"}.  Allergy  Studies: {Blank single:19197::"none","labs sent instead"," "}    {Blank single:19197::"Allergy testing results were read and interpreted by myself, documented by clinical staff."," "}      Malachi Bonds, MD  [facility]

## 2022-12-06 ENCOUNTER — Encounter: Payer: Self-pay | Admitting: Allergy & Immunology

## 2022-12-13 ENCOUNTER — Ambulatory Visit
Admission: RE | Admit: 2022-12-13 | Discharge: 2022-12-13 | Disposition: A | Discharge status: Home / Self Care | Payer: Commercial Managed Care - PPO | Source: Ambulatory Visit | Attending: Family Medicine | Admitting: Family Medicine

## 2022-12-13 ENCOUNTER — Encounter: Payer: Self-pay | Admitting: Orthopedic Surgery

## 2022-12-13 VITALS — BP 140/81 | HR 96 | Temp 99.4°F | Resp 18

## 2022-12-13 DIAGNOSIS — M25511 Pain in right shoulder: Secondary | ICD-10-CM

## 2022-12-13 MED ORDER — TIZANIDINE HCL 4 MG PO CAPS: 4.0000 mg | ORAL_CAPSULE | Freq: Three times a day (TID) | ORAL | 0 refills | Status: DC | PRN

## 2022-12-13 MED ORDER — DEXAMETHASONE SODIUM PHOSPHATE 10 MG/ML IJ SOLN
10.0000 mg | Freq: Once | INTRAMUSCULAR | Status: AC
  Administered 2022-12-13: 10 mg via INTRAMUSCULAR

## 2022-12-13 NOTE — ED Triage Notes (Signed)
Pt reports after sleeping with her arm over her head she woke up with right arm and shoulder pain x 2 days. Has reoccurring problems with her right arm.

## 2022-12-16 NOTE — ED Provider Notes (Signed)
[facility]    CSN: [id number] Arrival date & time: 12/13/22  0950      History   Chief Complaint Chief Complaint  Patient presents with   Shoulder Pain    Rotator cuff. I cannot lift my right arm up. - Entered by patient    HPI Jacqueline Parsons is a 52 y.o. female.   Patient presenting today with 2-day history of right shoulder pain that started after sleeping with the arm overhead.  She states this has been a recurrent issue for her for which she typically sees orthopedics, could not get in there today so wanted to come here.  Has had steroid injections to the shoulder in the past with good relief.  Denies radiation of pain, numbness, tingling, loss of range of motion, swelling, redness, traumatic injury.  Taking over-the-counter pain relievers with minimal relief.    Past Medical History:  Diagnosis Date   Allergy 1996   Anxiety    Cervical cancer (HCC) 2002   Depression    Gastric bypass status for obesity 2015   Hypertension    Oxygen deficiency    Sleep apnea 2007   Thyroid disease    Vaginal Pap smear, abnormal     Patient Active Problem List   Diagnosis Date Noted   Persistent cough 11/20/2022   RLQ abdominal pain 09/08/2022   Perimenopause 09/22/2021   Sleep disturbance 09/22/2021   Moody 09/22/2021   Night sweats 09/22/2021   Hot flashes 09/22/2021   Encounter for annual routine gynecological examination 09/22/2021   Anxiety and depression 09/22/2021   S/P hysterectomy 09/22/2021   Encounter for screening fecal occult blood testing 09/18/2020   Encounter for well woman exam with routine gynecological exam 09/18/2020   Thyroid disease 03/09/2019   History of cervical cancer 02/13/2019   Screening for colorectal cancer 02/13/2019   Vaginal Pap smear following hysterectomy for malignancy 02/13/2019   Dysphagia, idiopathic 01/04/2019   Swelling of right lower extremity 10/29/2017   Essential hypertension 10/29/2017   Hyperlipidemia  10/29/2017   Thyroid nodule 10/13/2017   Adult hypothyroidism 10/13/2017   Chronic edema 11/24/2016   Metatarsalgia of right foot 11/24/2016   Stress reaction of bone 11/24/2016   Morbid obesity (HCC) 2015    Past Surgical History:  Procedure Laterality Date   ABDOMINAL HYSTERECTOMY     arm reduction Bilateral 2021   CARPAL TUNNEL RELEASE     ROUX-EN-Y PROCEDURE  2015    OB History     Gravida  0   Para  0   Term  0   Preterm  0   AB  0   Living  0      SAB  0   IAB  0   Ectopic  0   Multiple  0   Live Births  0            Home Medications    Prior to Admission medications   Medication Sig Start Date End Date Taking? Authorizing Provider  tiZANidine (ZANAFLEX) 4 MG capsule Take 1 capsule (4 mg total) by mouth 3 (three) times daily as needed for muscle spasms. Do not drink alcohol or drive while taking this medication.  May cause drowsiness. 12/13/22  Yes Particia Nearing, PA-C  albuterol (VENTOLIN HFA) 108 (90 Base) MCG/ACT inhaler Inhale 2 puffs into the lungs every 6 (six) hours as needed for wheezing or shortness of breath. 11/20/22   Billie Lade, MD  Ascorbic Acid (VITAMIN C PO)  Take by mouth.    [provider]  benzonatate (TESSALON PERLES) 100 MG capsule Take 1 capsule (100 mg total) by mouth 3 (three) times daily as needed for cough. 11/20/22   Billie Lade, MD  calcium gluconate 500 MG tablet Take 1 tablet by mouth daily.    [provider]  cetirizine (ZYRTEC) 10 MG tablet Take 1 tablet (10 mg total) by mouth at bedtime. 12/04/22 03/04/23  Alfonse Spruce, MD  chlorthalidone (HYGROTON) 25 MG tablet Take 1 tablet (25 mg total) by mouth daily. 11/23/22   Billie Lade, MD  Cholecalciferol (VITAMIN D3) 5000 units TABS Take by mouth daily.     [provider]  diltiazem (DILACOR XR) 240 MG 24 hr capsule Take 240 mg by mouth daily.    [provider]  diphenhydrAMINE HCl (BENADRYL PO) Take by mouth.     [provider]  famotidine (PEPCID) 20 MG tablet Take 1 tablet (20 mg total) by mouth at bedtime. 12/04/22 03/04/23  Alfonse Spruce, MD  fluticasone [facility]) 50 MCG/ACT nasal spray Place 1 spray into both nostrils daily. 11/20/22   Billie Lade, MD  levothyroxine (SYNTHROID, LEVOTHROID) 88 MCG tablet Take 88 mcg by mouth daily. 08/23/17   [provider]  lisinopril (ZESTRIL) 40 MG tablet Take 1 tablet (40 mg total) by mouth daily. 11/20/22   Billie Lade, MD  montelukast (SINGULAIR) 10 MG tablet Take 1 tablet (10 mg total) by mouth at bedtime. 12/04/22   Alfonse Spruce, MD  pediatric multivitamin-iron (POLY-VI-SOL WITH IRON) 15 MG chewable tablet Chew 1 tablet by mouth daily.    [provider]  pravastatin (PRAVACHOL) 40 MG tablet Take 40 mg by mouth daily.    [provider]  tirzepatide (ZEPBOUND) 2.5 MG/0.5ML Pen Inject 2.5 mg into the skin once a week for 28 days. 11/20/22 12/18/22  Billie Lade, MD  valACYclovir (VALTREX) 500 MG tablet as needed. 11/14/18   [provider]  vitamin B-12 (CYANOCOBALAMIN) 500 MCG tablet Take by mouth.    [provider]    Family History Family History  Problem Relation Age of Onset   Alcohol abuse Mother    Alcohol abuse Father    Hypertension Maternal Aunt    Alcoholism Maternal Grandmother    Cancer Maternal Grandfather        lung   Thyroid disease Neg Hx    Colon cancer Neg Hx    Colon polyps Neg Hx    Allergic rhinitis Neg Hx    Angioedema Neg Hx    Asthma Neg Hx    Eczema Neg Hx    Urticaria Neg Hx     Social History Social History   Tobacco Use   Smoking status: Never    Passive exposure: Never   Smokeless tobacco: Never  Vaping Use   Vaping status: Never Used  Substance Use Topics   Alcohol use: Yes    Comment: occ   Drug use: No     Allergies   Dust mite extract   Review of Systems Review of Systems Per HPI  Physical Exam Triage Vital  Signs ED Triage Vitals  Encounter Vitals Group     BP 12/13/22 1014 (!) 140/81     Systolic BP Percentile --      Diastolic BP Percentile --      Pulse Rate 12/13/22 1014 96     Resp 12/13/22 1014 18     Temp 12/13/22  1014 99.4 F (37.4 C)     Temp Source 12/13/22 1014 Oral     SpO2 12/13/22 1014 98 %     Weight --      Height --      Head Circumference --      Peak Flow --      Pain Score 12/13/22 1018 8     Pain Loc --      Pain Education --      Exclude from Growth Chart --    No data found.  Updated Vital Signs BP (!) 140/81 (BP Location: Left Wrist)   Pulse 96   Temp 99.4 F (37.4 C) (Oral)   Resp 18   SpO2 98%   Visual Acuity Right Eye Distance:   Left Eye Distance:   Bilateral Distance:    Right Eye Near:   Left Eye Near:    Bilateral Near:     Physical Exam Vitals and nursing note reviewed.  Constitutional:      Appearance: Normal appearance. She is not ill-appearing.  HENT:     Head: Atraumatic.  Eyes:     Extraocular Movements: Extraocular movements intact.     Conjunctiva/sclera: Conjunctivae normal.  Cardiovascular:     Rate and Rhythm: Normal rate and regular rhythm.     Heart sounds: Normal heart sounds.  Pulmonary:     Effort: Pulmonary effort is normal.     Breath sounds: Normal breath sounds.  Musculoskeletal:        General: Tenderness present. No swelling, deformity or signs of injury. Normal range of motion.     Cervical back: Normal range of motion and neck supple.     Comments: Tenderness to palpation over right deltoid and anterior and posterior shoulder joint.  Range of motion intact but very painful.  Grip strength full and equal bilateral hands.  No spinal tenderness to palpation the midline  Skin:    General: Skin is warm and dry.     Findings: No erythema.  Neurological:     Mental Status: She is alert and oriented to person, place, and time.     Motor: No weakness.     Gait: Gait normal.     Comments: Bilateral upper  extremities neurovascularly intact  Psychiatric:        Mood and Affect: Mood normal.        Thought Content: Thought content normal.        Judgment: Judgment normal.      UC Treatments / Results  Labs (all labs ordered are listed, but only abnormal results are displayed) Labs Reviewed - No data to display  EKG   Radiology No results found.  Procedures Procedures (including critical care time)  Medications Ordered in UC Medications  dexamethasone (DECADRON) injection 10 mg (10 mg Intramuscular Given 12/13/22 1058)    Initial Impression / Assessment and Plan / UC Course  I have reviewed the triage vital signs and the nursing notes.  Pertinent labs & imaging results that were available during my care of the patient were reviewed by me and considered in my medical decision making (see chart for details).     Treat with IM Decadron, Zanaflex, stretches, heat, massage, Ortho follow-up.  Return for worsening symptoms.  Final Clinical Impressions(s) / UC Diagnoses   Final diagnoses:  Acute pain of right shoulder   Discharge Instructions   None    ED Prescriptions     Medication Sig Dispense Auth. Provider   tiZANidine (  ZANAFLEX) 4 MG capsule Take 1 capsule (4 mg total) by mouth 3 (three) times daily as needed for muscle spasms. Do not drink alcohol or drive while taking this medication.  May cause drowsiness. 15 capsule Particia Nearing, [place]      PDMP not reviewed this encounter.   Roosvelt Maser [place], [place] 12/16/22 [phone]

## 2022-12-18 ENCOUNTER — Encounter: Payer: Self-pay | Admitting: Internal Medicine

## 2022-12-18 ENCOUNTER — Other Ambulatory Visit (HOSPITAL_COMMUNITY): Payer: Self-pay

## 2022-12-18 ENCOUNTER — Other Ambulatory Visit: Payer: Self-pay

## 2022-12-18 ENCOUNTER — Other Ambulatory Visit: Payer: Self-pay | Admitting: Internal Medicine

## 2022-12-18 MED ORDER — ZEPBOUND 5 MG/0.5ML ~~LOC~~ SOAJ
5.0000 mg | SUBCUTANEOUS | 0 refills | Status: DC
Start: 2022-12-18 — End: 2023-01-19
  Filled 2022-12-18 (×2): qty 2, 28d supply, fill #0

## 2022-12-18 MED ORDER — ZEPBOUND 5 MG/0.5ML ~~LOC~~ SOAJ
5.0000 mg | SUBCUTANEOUS | 0 refills | Status: DC
Start: 2022-12-18 — End: 2022-12-18

## 2022-12-21 NOTE — Telephone Encounter (Signed)
Entered in error, did not speak with patient.

## 2022-12-21 NOTE — Telephone Encounter (Signed)
Spoke with patient.

## 2023-01-04 ENCOUNTER — Other Ambulatory Visit: Payer: Self-pay | Admitting: Internal Medicine

## 2023-01-04 ENCOUNTER — Other Ambulatory Visit (HOSPITAL_COMMUNITY): Payer: Self-pay

## 2023-01-04 MED ORDER — LEVOTHYROXINE SODIUM 88 MCG PO TABS
88.0000 ug | ORAL_TABLET | Freq: Every day | ORAL | 3 refills | Status: DC
  Filled 2023-01-04 – 2023-02-06 (×2): qty 30, 30d supply, fill #0

## 2023-01-07 ENCOUNTER — Ambulatory Visit (HOSPITAL_COMMUNITY)
Admission: RE | Admit: 2023-01-07 | Discharge: 2023-01-07 | Disposition: A | Discharge status: Home / Self Care | Payer: Commercial Managed Care - PPO | Source: Ambulatory Visit | Attending: Orthopedic Surgery | Admitting: Orthopedic Surgery

## 2023-01-07 DIAGNOSIS — M25511 Pain in right shoulder: Secondary | ICD-10-CM | POA: Diagnosis present

## 2023-01-19 ENCOUNTER — Other Ambulatory Visit: Payer: Self-pay | Admitting: Internal Medicine

## 2023-01-20 ENCOUNTER — Other Ambulatory Visit (HOSPITAL_COMMUNITY): Payer: Self-pay

## 2023-01-20 MED ORDER — ZEPBOUND 5 MG/0.5ML ~~LOC~~ SOAJ
5.0000 mg | SUBCUTANEOUS | 0 refills | Status: DC
  Filled 2023-01-20 – 2023-01-21 (×2): qty 2, 28d supply, fill #0

## 2023-01-21 ENCOUNTER — Other Ambulatory Visit (HOSPITAL_COMMUNITY): Payer: Self-pay

## 2023-02-06 ENCOUNTER — Other Ambulatory Visit (HOSPITAL_COMMUNITY): Payer: Self-pay

## 2023-02-08 ENCOUNTER — Other Ambulatory Visit (HOSPITAL_COMMUNITY): Payer: Self-pay

## 2023-02-09 ENCOUNTER — Other Ambulatory Visit (HOSPITAL_COMMUNITY): Payer: Self-pay

## 2023-02-09 ENCOUNTER — Encounter (HOSPITAL_COMMUNITY): Payer: Self-pay

## 2023-02-16 ENCOUNTER — Other Ambulatory Visit (HOSPITAL_COMMUNITY): Payer: Self-pay

## 2023-02-16 ENCOUNTER — Other Ambulatory Visit: Payer: Self-pay | Admitting: Internal Medicine

## 2023-02-16 ENCOUNTER — Other Ambulatory Visit: Payer: Self-pay

## 2023-02-16 ENCOUNTER — Encounter: Payer: Self-pay | Admitting: Internal Medicine

## 2023-02-16 DIAGNOSIS — I10 Essential (primary) hypertension: Secondary | ICD-10-CM

## 2023-02-16 MED ORDER — ZEPBOUND 7.5 MG/0.5ML ~~LOC~~ SOAJ
7.5000 mg | SUBCUTANEOUS | 0 refills | Status: DC
  Filled 2023-02-16 – 2023-02-17 (×2): qty 2, 28d supply, fill #0

## 2023-02-16 MED ORDER — ZEPBOUND 5 MG/0.5ML ~~LOC~~ SOAJ
5.0000 mg | SUBCUTANEOUS | 0 refills | Status: DC
  Filled 2023-02-16: qty 2, 28d supply, fill #0

## 2023-02-17 ENCOUNTER — Other Ambulatory Visit (HOSPITAL_COMMUNITY): Payer: Self-pay

## 2023-02-22 ENCOUNTER — Ambulatory Visit (INDEPENDENT_AMBULATORY_CARE_PROVIDER_SITE_OTHER): Payer: Commercial Managed Care - PPO | Admitting: Internal Medicine

## 2023-02-22 ENCOUNTER — Encounter: Payer: Self-pay | Admitting: Internal Medicine

## 2023-02-22 VITALS — BP 138/86 | HR 71 | Ht 64.0 in | Wt 305.0 lb

## 2023-02-22 DIAGNOSIS — I10 Essential (primary) hypertension: Secondary | ICD-10-CM

## 2023-02-22 NOTE — Progress Notes (Signed)
Established Patient Office Visit  Subjective   Patient ID: Jacqueline Parsons, female    DOB: 25-Aug-1970  Age: 52 y.o. MRN: [id number]  Chief Complaint  Patient presents with   Follow-up    3 mo    Jacqueline Parsons returns to care today for weight loss management and HTN follow-up.  She was last evaluated by me on 7/5 at which time Zepbound was started for treatment of morbid obesity.  Chlorthalidone 25 mg daily was also added to her antihypertensive regimen.  In the interim she has been evaluated by allergy and immunology for follow-up.  ED presentation on 7/28 endorsing right shoulder pain.  There have otherwise been no acute interval events.  Jacqueline Parsons reports feeling well today.  She is asymptomatic and has no acute concerns to discuss.  She has lost 16 pounds since starting Zepbound and is pleased with her progress.  Past Medical History:  Diagnosis Date   Allergy 1996   Anxiety    Cervical cancer (HCC) 2002   Depression    Gastric bypass status for obesity 2015   Hypertension    Oxygen deficiency    Sleep apnea 2007   Thyroid disease    Vaginal Pap smear, abnormal    Past Surgical History:  Procedure Laterality Date   ABDOMINAL HYSTERECTOMY     arm reduction Bilateral 2021   CARPAL TUNNEL RELEASE     ROUX-EN-Y PROCEDURE  2015   Social History   Tobacco Use   Smoking status: Never    Passive exposure: Never   Smokeless tobacco: Never  Vaping Use   Vaping status: Never Used  Substance Use Topics   Alcohol use: Yes    Comment: occ   Drug use: No   Family History  Problem Relation Age of Onset   Alcohol abuse Mother    Alcohol abuse Father    Hypertension Maternal Aunt    Alcoholism Maternal Grandmother    Cancer Maternal Grandfather        lung   Thyroid disease Neg Hx    Colon cancer Neg Hx    Colon polyps Neg Hx    Allergic rhinitis Neg Hx    Angioedema Neg Hx    Asthma Neg Hx    Eczema Neg Hx    Urticaria Neg Hx    Allergies  Allergen Reactions    Dust Mite Extract Cough, Hives and Itching   Review of Systems  Constitutional:  Negative for chills and fever.  HENT:  Negative for sore throat.   Respiratory:  Negative for cough and shortness of breath.   Cardiovascular:  Negative for chest pain, palpitations and leg swelling.  Gastrointestinal:  Negative for abdominal pain, blood in stool, constipation, diarrhea, nausea and vomiting.  Genitourinary:  Negative for dysuria and hematuria.  Musculoskeletal:  Negative for myalgias.  Skin:  Negative for itching and rash.  Neurological:  Negative for dizziness and headaches.  Psychiatric/Behavioral:  Negative for depression and suicidal ideas.      Objective:     BP 138/86   Pulse 71   Ht 5\' 4"  (1.626 m)   Wt (!) 305 lb 0.3 oz (138.4 kg)   SpO2 94%   BMI 52.36 kg/m  BP Readings from Last 3 Encounters:  02/22/23 138/86  12/13/22 (!) 140/81  12/04/22 130/80   Physical Exam Vitals reviewed.  Constitutional:      General: She is not in acute distress.    Appearance: Normal appearance. She is obese. She is  not toxic-appearing.  HENT:     Head: Normocephalic and atraumatic.     Right Ear: External ear normal.     Left Ear: External ear normal.     Nose: Nose normal. No congestion or rhinorrhea.     Mouth/Throat:     Mouth: Mucous membranes are moist.     Pharynx: Oropharynx is clear. No oropharyngeal exudate or posterior oropharyngeal erythema.  Eyes:     General: No scleral icterus.    Extraocular Movements: Extraocular movements intact.     Conjunctiva/sclera: Conjunctivae normal.     Pupils: Pupils are equal, round, and reactive to light.  Cardiovascular:     Rate and Rhythm: Normal rate and regular rhythm.     Pulses: Normal pulses.     Heart sounds: Normal heart sounds. No murmur heard.    No friction rub. No gallop.  Pulmonary:     Effort: Pulmonary effort is normal.     Breath sounds: Normal breath sounds. No wheezing, rhonchi or rales.  Abdominal:     General:  Abdomen is flat. Bowel sounds are normal. There is no distension.     Palpations: Abdomen is soft.     Tenderness: There is no abdominal tenderness.  Musculoskeletal:        General: No swelling. Normal range of motion.     Cervical back: Normal range of motion.     Right lower leg: No edema.     Left lower leg: No edema.  Lymphadenopathy:     Cervical: No cervical adenopathy.  Skin:    General: Skin is warm and dry.     Capillary Refill: Capillary refill takes less than 2 seconds.     Coloration: Skin is not jaundiced.  Neurological:     General: No focal deficit present.     Mental Status: She is alert and oriented to person, place, and time.  Psychiatric:        Mood and Affect: Mood normal.        Behavior: Behavior normal.   Last CBC Lab Results  Component Value Date   WBC 5.2 07/19/2017   HGB 12.3 07/19/2017   HCT 39.9 07/19/2017   MCV 84.4 07/19/2017   MCH 26.0 07/19/2017   RDW 15.5 07/19/2017   PLT 225 07/19/2017   Last metabolic panel Lab Results  Component Value Date   GLUCOSE 92 07/19/2017   NA 139 07/19/2017   K 3.3 (L) 07/19/2017   CL 100 (L) 07/19/2017   CO2 29 07/19/2017   BUN 16 07/19/2017   CREATININE 0.78 07/19/2017   GFRNONAA >60 07/19/2017   CALCIUM 9.6 07/19/2017   PROT 7.2 07/19/2017   ALBUMIN 4.0 07/19/2017   BILITOT 0.1 (L) 07/19/2017   ALKPHOS 60 07/19/2017   AST 24 07/19/2017   ALT 20 07/19/2017   ANIONGAP 10 07/19/2017   Last thyroid functions Lab Results  Component Value Date   TSH 2.24 10/13/2017   The 10-year ASCVD risk score (Arnett DK, et al., 2019) is: 3.1%    Assessment & Plan:   Problem List Items Addressed This Visit       Essential hypertension - Primary    Presenting today for HTN follow-up.  BP today is 138/86.  Chlorthalidone 25 mg daily was added to her antihypertensive regimen at her last appointment.  She is additionally prescribed lisinopril 40 mg daily and diltiazem 240 mg daily. -No additional medication  changes are indicated today.      Morbid obesity (HCC)  Presenting today for weight loss management.  Zepbound was started in July.  She is currently prescribed 7.5 mg weekly.  Her weight today is 305 pounds, which is down 16 pounds since July (321 lbs).  She is pleased with her progress and motivated to continue making lifestyle modifications aimed at weight loss. -Plan to increase Zepbound to 10 mg weekly after she completes 4 injections of 7.5 mg.       Return in about 3 months (around 05/25/2023).    Billie Lade, MD

## 2023-02-22 NOTE — Patient Instructions (Signed)
It was a pleasure to see you today.  Thank you for giving [place] the opportunity to be involved in your care.  Below is a brief recap of your visit and next steps.  We will plan to see you again in 3 months.  Summary NO medication changes today Plan to increase Zepbound to 10 mg weekly after completing 4 injections of 7.5.  Follow up in 3 months

## 2023-02-26 ENCOUNTER — Other Ambulatory Visit (HOSPITAL_COMMUNITY): Payer: Self-pay

## 2023-03-02 ENCOUNTER — Encounter: Payer: Self-pay | Admitting: Internal Medicine

## 2023-03-08 ENCOUNTER — Encounter: Payer: Self-pay | Admitting: Internal Medicine

## 2023-03-09 ENCOUNTER — Other Ambulatory Visit: Payer: Self-pay

## 2023-03-09 MED ORDER — LEVOTHYROXINE SODIUM 88 MCG PO TABS: 88.0000 ug | ORAL_TABLET | Freq: Every day | ORAL | 3 refills | Status: DC

## 2023-03-09 NOTE — Assessment & Plan Note (Signed)
Presenting today for weight loss management.  Zepbound was started in July.  She is currently prescribed 7.5 mg weekly.  Her weight today is 305 pounds, which is down 16 pounds since July (321 lbs).  She is pleased with her progress and motivated to continue making lifestyle modifications aimed at weight loss. -Plan to increase Zepbound to 10 mg weekly after she completes 4 injections of 7.5 mg.

## 2023-03-09 NOTE — Assessment & Plan Note (Signed)
Presenting today for HTN follow-up.  BP today is 138/86.  Chlorthalidone 25 mg daily was added to her antihypertensive regimen at her last appointment.  She is additionally prescribed lisinopril 40 mg daily and diltiazem 240 mg daily. -No additional medication changes are indicated today.

## 2023-03-14 ENCOUNTER — Encounter: Payer: Self-pay | Admitting: Internal Medicine

## 2023-03-17 ENCOUNTER — Other Ambulatory Visit (HOSPITAL_COMMUNITY): Payer: Self-pay

## 2023-03-17 MED ORDER — ZEPBOUND 10 MG/0.5ML ~~LOC~~ SOAJ
10.0000 mg | SUBCUTANEOUS | 0 refills | Status: DC
  Filled 2023-03-17: qty 2, 28d supply, fill #0

## 2023-04-09 ENCOUNTER — Ambulatory Visit
Admission: RE | Admit: 2023-04-09 | Discharge: 2023-04-09 | Disposition: A | Discharge status: Home / Self Care | Payer: Commercial Managed Care - PPO | Source: Ambulatory Visit | Attending: Nurse Practitioner | Admitting: Nurse Practitioner

## 2023-04-09 ENCOUNTER — Ambulatory Visit: Payer: Commercial Managed Care - PPO

## 2023-04-09 VITALS — BP 145/93 | HR 90 | Temp 98.7°F | Resp 16

## 2023-04-09 DIAGNOSIS — R062 Wheezing: Secondary | ICD-10-CM | POA: Diagnosis not present

## 2023-04-09 DIAGNOSIS — R059 Cough, unspecified: Secondary | ICD-10-CM

## 2023-04-09 MED ORDER — ALBUTEROL SULFATE (2.5 MG/3ML) 0.083% IN NEBU
2.5000 mg | INHALATION_SOLUTION | Freq: Once | RESPIRATORY_TRACT | Status: AC
  Administered 2023-04-09: 2.5 mg via RESPIRATORY_TRACT

## 2023-04-09 MED ORDER — PROMETHAZINE-DM 6.25-15 MG/5ML PO SYRP: 5.0000 mL | ORAL_SOLUTION | Freq: Four times a day (QID) | ORAL | 0 refills | Status: DC | PRN

## 2023-04-09 MED ORDER — AMOXICILLIN-POT CLAVULANATE 875-125 MG PO TABS: 1.0000 | ORAL_TABLET | Freq: Two times a day (BID) | ORAL | 0 refills | Status: DC

## 2023-04-09 MED ORDER — METHYLPREDNISOLONE SODIUM SUCC 125 MG IJ SOLR
80.0000 mg | Freq: Once | INTRAMUSCULAR | Status: AC
  Administered 2023-04-09: 80 mg via INTRAMUSCULAR

## 2023-04-09 NOTE — ED Triage Notes (Signed)
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  States she is now having chest congestion and wheezing,saw the nurse today again and she sent in prednisone for her.  States she wants to have a chest xray.

## 2023-04-09 NOTE — Discharge Instructions (Addendum)
Chest x-ray is pending.  You will be contacted if the pending test result is abnormal.  You will also have access to the result via MyChart. Stop amoxicillin.  Begin Augmentin.  Take medication as directed. Increase fluids and allow for plenty of rest. May take over-the-counter Tylenol or ibuprofen as needed for pain, fever, general discomfort. May use normal saline nasal spray throughout the day to help with nasal congestion. For the cough, recommend using a humidifier in your bedroom at nighttime during sleep and sleeping slightly elevated on pillows while symptoms persist. If you experience worsening wheezing, shortness of breath, difficulty breathing, or become unable to speak in a complete sentence, please go to the emergency department immediately. If your symptoms fail to improve, you may follow-up in this clinic or with your primary care physician for further evaluation. Follow-up as needed.

## 2023-04-09 NOTE — ED Provider Notes (Signed)
[facility]    CSN: [id number] Arrival date & time: 04/09/23  1751      History   Chief Complaint Chief Complaint  Patient presents with   Cough    HPI Jacqueline Parsons is a 52 y.o. female.   The history is provided by the patient.   Patient presents for complaints of wheezing and chest congestion that started over the past several days.  Patient reports that she is being treated for sinus infection at her job.  She is currently taking amoxicillin and is using an albuterol inhaler.  Patient states that she did see the nurse at her job who prescribed prednisone for her.  Patient denies fever, chills, headache, runny nose, ear pain, ear drainage, difficulty breathing, chest pain, abdominal pain, nausea, vomiting, or diarrhea.  Patient reports she is on day four of the amoxicillin.  Patient reports history of bronchitis.  Denies history of smoking or COPD.  Past Medical History:  Diagnosis Date   Allergy 1996   Anxiety    Cervical cancer (HCC) 2002   Depression    Gastric bypass status for obesity 2015   Hypertension    Oxygen deficiency    Sleep apnea 2007   Thyroid disease    Vaginal Pap smear, abnormal     Patient Active Problem List   Diagnosis Date Noted   Persistent cough 11/20/2022   RLQ abdominal pain 09/08/2022   Perimenopause 09/22/2021   Sleep disturbance 09/22/2021   Moody 09/22/2021   Night sweats 09/22/2021   Hot flashes 09/22/2021   Encounter for annual routine gynecological examination 09/22/2021   Anxiety and depression 09/22/2021   S/P hysterectomy 09/22/2021   Encounter for screening fecal occult blood testing 09/18/2020   Encounter for well woman exam with routine gynecological exam 09/18/2020   Thyroid disease 03/09/2019   History of cervical cancer 02/13/2019   Screening for colorectal cancer 02/13/2019   Vaginal Pap smear following hysterectomy for malignancy 02/13/2019   Dysphagia, idiopathic 01/04/2019   Swelling of right  lower extremity 10/29/2017   Essential hypertension 10/29/2017   Hyperlipidemia 10/29/2017   Thyroid nodule 10/13/2017   Adult hypothyroidism 10/13/2017   Chronic edema 11/24/2016   Metatarsalgia of right foot 11/24/2016   Stress reaction of bone 11/24/2016   Morbid obesity (HCC) 2015    Past Surgical History:  Procedure Laterality Date   ABDOMINAL HYSTERECTOMY     arm reduction Bilateral 2021   CARPAL TUNNEL RELEASE     ROUX-EN-Y PROCEDURE  2015    OB History     Gravida  0   Para  0   Term  0   Preterm  0   AB  0   Living  0      SAB  0   IAB  0   Ectopic  0   Multiple  0   Live Births  0            Home Medications    Prior to Admission medications   Medication Sig Start Date End Date Taking? Authorizing Provider  amoxicillin-clavulanate (AUGMENTIN) 875-125 MG tablet Take 1 tablet by mouth every 12 (twelve) hours. 04/09/23  Yes Leath-Warren, Sadie Haber, NP  promethazine-dextromethorphan (PROMETHAZINE-DM) 6.25-15 MG/5ML syrup Take 5 mLs by mouth 4 (four) times daily as needed. 04/09/23  Yes Leath-Warren, Sadie Haber, NP  albuterol (VENTOLIN HFA) 108 (90 Base) MCG/ACT inhaler Inhale 2 puffs into the lungs every 6 (six) hours as needed for wheezing or shortness of breath.  11/20/22   Billie Lade, MD  Ascorbic Acid (VITAMIN C PO) Take by mouth.    [provider]  benzonatate (TESSALON PERLES) 100 MG capsule Take 1 capsule (100 mg total) by mouth 3 (three) times daily as needed for cough. 11/20/22   Billie Lade, MD  calcium gluconate 500 MG tablet Take 1 tablet by mouth daily.    [provider]  cetirizine (ZYRTEC) 10 MG tablet Take 1 tablet (10 mg total) by mouth at bedtime. 12/04/22 03/04/23  Alfonse Spruce, MD  chlorthalidone (HYGROTON) 25 MG tablet TAKE 1 TABLET(25 MG) BY MOUTH DAILY 02/16/23   Billie Lade, MD  Cholecalciferol (VITAMIN D3) 5000 units TABS Take by mouth daily.     [provider]  diltiazem  (DILACOR XR) 240 MG 24 hr capsule Take 240 mg by mouth daily.    [provider]  diphenhydrAMINE HCl (BENADRYL PO) Take by mouth.    [provider]  famotidine (PEPCID) 20 MG tablet Take 1 tablet (20 mg total) by mouth at bedtime. 12/04/22 03/04/23  Alfonse Spruce, MD  fluticasone [facility]) 50 MCG/ACT nasal spray Place 1 spray into both nostrils daily. 11/20/22   Billie Lade, MD  levothyroxine (SYNTHROID) 88 MCG tablet Take 1 tablet (88 mcg total) by mouth daily. 03/09/23   Billie Lade, MD  lisinopril (ZESTRIL) 40 MG tablet Take 1 tablet (40 mg total) by mouth daily. 11/20/22   Billie Lade, MD  montelukast (SINGULAIR) 10 MG tablet Take 1 tablet (10 mg total) by mouth at bedtime. 12/04/22   Alfonse Spruce, MD  pediatric multivitamin-iron (POLY-VI-SOL WITH IRON) 15 MG chewable tablet Chew 1 tablet by mouth daily.    [provider]  pravastatin (PRAVACHOL) 40 MG tablet Take 40 mg by mouth daily.    [provider]  tirzepatide (ZEPBOUND) 10 MG/0.5ML Pen Inject 10 mg into the skin once a week for 28 days. 03/17/23 04/14/23  Billie Lade, MD  tiZANidine (ZANAFLEX) 4 MG capsule Take 1 capsule (4 mg total) by mouth 3 (three) times daily as needed for muscle spasms. Do not drink alcohol or drive while taking this medication.  May cause drowsiness. 12/13/22   Particia Nearing, PA-C  valACYclovir (VALTREX) 500 MG tablet as needed. 11/14/18   [provider]  vitamin B-12 (CYANOCOBALAMIN) 500 MCG tablet Take by mouth.    [provider]    Family History Family History  Problem Relation Age of Onset   Alcohol abuse Mother    Alcohol abuse Father    Hypertension Maternal Aunt    Alcoholism Maternal Grandmother    Cancer Maternal Grandfather        lung   Thyroid disease Neg Hx    Colon cancer Neg Hx    Colon polyps Neg Hx    Allergic rhinitis Neg Hx    Angioedema Neg Hx    Asthma Neg Hx    Eczema Neg Hx     Urticaria Neg Hx     Social History Social History   Tobacco Use   Smoking status: Never    Passive exposure: Never   Smokeless tobacco: Never  Vaping Use   Vaping status: Never Used  Substance Use Topics   Alcohol use: Yes    Comment: occ   Drug use: No     Allergies   Dust mite extract   Review of Systems Review of Systems Per HPI  Physical Exam Triage Vital  Signs ED Triage Vitals [04/09/23 1800]  Encounter Vitals Group     BP (!) 145/93     Systolic BP Percentile      Diastolic BP Percentile      Pulse Rate 90     Resp 16     Temp 98.7 F (37.1 C)     Temp Source Oral     SpO2 97 %     Weight      Height      Head Circumference      Peak Flow      Pain Score      Pain Loc      Pain Education      Exclude from Growth Chart    No data found.  Updated Vital Signs BP (!) 145/93 (BP Location: Right Arm)   Pulse 90   Temp 98.7 F (37.1 C) (Oral)   Resp 16   SpO2 97%   Visual Acuity Right Eye Distance:   Left Eye Distance:   Bilateral Distance:    Right Eye Near:   Left Eye Near:    Bilateral Near:     Physical Exam Vitals and nursing note reviewed.  Constitutional:      General: She is not in acute distress.    Appearance: Normal appearance.  HENT:     Head: Normocephalic.  Eyes:     Extraocular Movements: Extraocular movements intact.     Pupils: Pupils are equal, round, and reactive to light.  Cardiovascular:     Rate and Rhythm: Normal rate and regular rhythm.     Pulses: Normal pulses.     Heart sounds: Normal heart sounds.  Pulmonary:     Effort: Pulmonary effort is normal.     Breath sounds: Wheezing (posterior bilateral upper and lower lobes) present.     Comments: Albuterol nebulizer administered.  Post nebulizer, patient with continued but improved rhonchi and wheezing in the posterior bilateral lower lobes. Abdominal:     General: Bowel sounds are normal.     Palpations: Abdomen is soft.     Tenderness: There is no  abdominal tenderness.  Musculoskeletal:     Cervical back: Normal range of motion.  Lymphadenopathy:     Cervical: No cervical adenopathy.  Neurological:     General: No focal deficit present.     Mental Status: She is alert and oriented to person, place, and time.  Psychiatric:        Mood and Affect: Mood normal.        Behavior: Behavior normal.      UC Treatments / Results  Labs (all labs ordered are listed, but only abnormal results are displayed) Labs Reviewed - No data to display  EKG   Radiology No results found.  Procedures Procedures (including critical care time)  Medications Ordered in UC Medications  albuterol (PROVENTIL) (2.5 MG/3ML) 0.083% nebulizer solution 2.5 mg (2.5 mg Nebulization Given 04/09/23 1830)  methylPREDNISolone sodium succinate (SOLU-MEDROL) 125 mg/2 mL injection 80 mg (80 mg Intramuscular Given 04/09/23 1830)    Initial Impression / Assessment and Plan / UC Course  I have reviewed the triage vital signs and the nursing notes.  Pertinent labs & imaging results that were available during my care of the patient were reviewed by me and considered in my medical decision making (see chart for details).  On exam, patient with wheezing and rhonchi in the posterior bilateral lower lobes.  Nebulizer was administered patient with improved wheezing and rhonchi, but  remained persistent.  Chest x-ray is pending.  Will switch patient's antibiotic to Augmentin 875/125 mg to cover for possible lower respiratory infection.  Promethazine DM also prescribed for cough.  Patient advised to begin prednisone on 01/08/2023, Solu-Medrol 80 mg IM administered during this appointment to help with bronchospasm.  Supportive care recommendations were provided to include over-the-counter analgesics, increasing fluids, allowing for plenty of rest, and use of a humidifier in the bedroom at nighttime during sleep.  Patient was given strict ER follow-up precautions.  Patient was  also given indications of when to follow-up in this clinic or with her PCP.  Patient was in agreement with this plan of care and verbalized understanding.  All questions were answered.  Patient stable for discharge.  Final Clinical Impressions(s) / UC Diagnoses   Final diagnoses:  Wheezing  Cough, unspecified type     Discharge Instructions      Chest x-ray is pending.  You will be contacted if the pending test result is abnormal.  You will also have access to the result via MyChart. Stop amoxicillin.  Begin Augmentin.  Take medication as directed. Increase fluids and allow for plenty of rest. May take over-the-counter Tylenol or ibuprofen as needed for pain, fever, general discomfort. May use normal saline nasal spray throughout the day to help with nasal congestion. For the cough, recommend using a humidifier in your bedroom at nighttime during sleep and sleeping slightly elevated on pillows while symptoms persist. If you experience worsening wheezing, shortness of breath, difficulty breathing, or become unable to speak in a complete sentence, please go to the emergency department immediately. If your symptoms fail to improve, you may follow-up in this clinic or with your primary care physician for further evaluation. Follow-up as needed.     ED Prescriptions     Medication Sig Dispense Auth. Provider   amoxicillin-clavulanate (AUGMENTIN) 875-125 MG tablet Take 1 tablet by mouth every 12 (twelve) hours. 14 tablet Leath-Warren, Sadie Haber, NP   promethazine-dextromethorphan (PROMETHAZINE-DM) 6.25-15 MG/5ML syrup Take 5 mLs by mouth 4 (four) times daily as needed. 118 mL Leath-Warren, Sadie Haber, NP      PDMP not reviewed this encounter.   Abran Cantor, NP 04/09/23 1857

## 2023-04-10 ENCOUNTER — Telehealth: Payer: Self-pay | Admitting: Nurse Practitioner

## 2023-04-10 ENCOUNTER — Encounter: Payer: Self-pay | Admitting: Internal Medicine

## 2023-04-10 NOTE — Telephone Encounter (Signed)
Call patient to discuss chest x-ray result.  Spoke with patient, verified patient with 2 patient identifiers.  Advised patient that chest x-ray was normal, will proceed with current plan of care.  Patient was advised to follow-up if symptoms worsen.  Patient was in agreement with this plan of care and verbalized understanding.  All questions were answered.

## 2023-04-11 ENCOUNTER — Other Ambulatory Visit: Payer: Self-pay | Admitting: Internal Medicine

## 2023-04-12 ENCOUNTER — Other Ambulatory Visit: Payer: Self-pay

## 2023-04-12 MED ORDER — ZEPBOUND 12.5 MG/0.5ML ~~LOC~~ SOAJ
12.5000 mg | SUBCUTANEOUS | 0 refills | Status: AC
  Filled 2023-04-12: qty 2, 28d supply, fill #0

## 2023-04-12 MED ORDER — DILTIAZEM HCL ER 240 MG PO CP24: 240.0000 mg | ORAL_CAPSULE | Freq: Every day | ORAL | 0 refills | Status: DC

## 2023-04-23 ENCOUNTER — Other Ambulatory Visit (HOSPITAL_COMMUNITY): Payer: Self-pay | Admitting: Adult Health

## 2023-04-23 DIAGNOSIS — Z1231 Encounter for screening mammogram for malignant neoplasm of breast: Secondary | ICD-10-CM

## 2023-04-26 ENCOUNTER — Inpatient Hospital Stay (HOSPITAL_COMMUNITY): Admission: RE | Admit: 2023-04-26 | Payer: Commercial Managed Care - PPO | Source: Ambulatory Visit

## 2023-04-26 ENCOUNTER — Encounter (HOSPITAL_COMMUNITY): Payer: Self-pay

## 2023-04-26 DIAGNOSIS — Z1231 Encounter for screening mammogram for malignant neoplasm of breast: Secondary | ICD-10-CM

## 2023-05-07 ENCOUNTER — Other Ambulatory Visit: Payer: Self-pay | Admitting: Internal Medicine

## 2023-05-07 ENCOUNTER — Other Ambulatory Visit (HOSPITAL_COMMUNITY): Payer: Self-pay | Admitting: Internal Medicine

## 2023-05-07 DIAGNOSIS — Z1231 Encounter for screening mammogram for malignant neoplasm of breast: Secondary | ICD-10-CM

## 2023-05-10 ENCOUNTER — Ambulatory Visit (HOSPITAL_COMMUNITY)
Admission: RE | Admit: 2023-05-10 | Discharge: 2023-05-10 | Disposition: A | Discharge status: Home / Self Care | Payer: Commercial Managed Care - PPO | Source: Ambulatory Visit | Attending: Internal Medicine | Admitting: Internal Medicine

## 2023-05-10 ENCOUNTER — Other Ambulatory Visit: Payer: Self-pay | Admitting: Internal Medicine

## 2023-05-10 DIAGNOSIS — Z1231 Encounter for screening mammogram for malignant neoplasm of breast: Secondary | ICD-10-CM | POA: Insufficient documentation

## 2023-05-10 MED ORDER — PRAVASTATIN SODIUM 40 MG PO TABS: 40.0000 mg | ORAL_TABLET | Freq: Every day | ORAL | 0 refills | Status: DC

## 2023-05-13 ENCOUNTER — Other Ambulatory Visit: Payer: Self-pay

## 2023-05-13 ENCOUNTER — Encounter: Payer: Self-pay | Admitting: Internal Medicine

## 2023-05-13 ENCOUNTER — Other Ambulatory Visit (HOSPITAL_COMMUNITY): Payer: Self-pay

## 2023-05-13 MED ORDER — ZEPBOUND 15 MG/0.5ML ~~LOC~~ SOAJ
15.0000 mg | SUBCUTANEOUS | 0 refills | Status: DC
  Filled 2023-05-13: qty 2, 28d supply, fill #0

## 2023-05-20 ENCOUNTER — Other Ambulatory Visit: Payer: Self-pay | Admitting: Internal Medicine

## 2023-05-20 DIAGNOSIS — I10 Essential (primary) hypertension: Secondary | ICD-10-CM

## 2023-06-11 ENCOUNTER — Encounter: Payer: Self-pay | Admitting: Internal Medicine

## 2023-06-11 ENCOUNTER — Other Ambulatory Visit: Payer: Self-pay

## 2023-06-11 ENCOUNTER — Other Ambulatory Visit (HOSPITAL_COMMUNITY): Payer: Self-pay

## 2023-06-11 MED ORDER — ZEPBOUND 15 MG/0.5ML ~~LOC~~ SOAJ
15.0000 mg | SUBCUTANEOUS | 1 refills | Status: DC
  Filled 2023-06-11: qty 2, 28d supply, fill #0
  Filled 2023-07-07: qty 2, 28d supply, fill #1
  Filled 2023-08-03 – 2023-08-12 (×2): qty 2, 28d supply, fill #2
  Filled 2023-09-02 – 2023-09-03 (×2): qty 2, 28d supply, fill #3

## 2023-06-18 IMAGING — MG MM DIGITAL SCREENING BILAT W/ TOMO AND CAD
6 of 12 series · 6 of 36 positions shown · non-contrast
Comparison: Previous exam(s).

CLINICAL DATA: Screening.

EXAM:
DIGITAL SCREENING BILATERAL MAMMOGRAM WITH TOMOSYNTHESIS AND CAD
TECHNIQUE: Bilateral screening digital craniocaudal and mediolateral oblique
mammograms were obtained. Bilateral screening digital breast
tomosynthesis was performed. The images were evaluated with
computer-aided detection.

[R MLO synth-2D (1 of 2)]
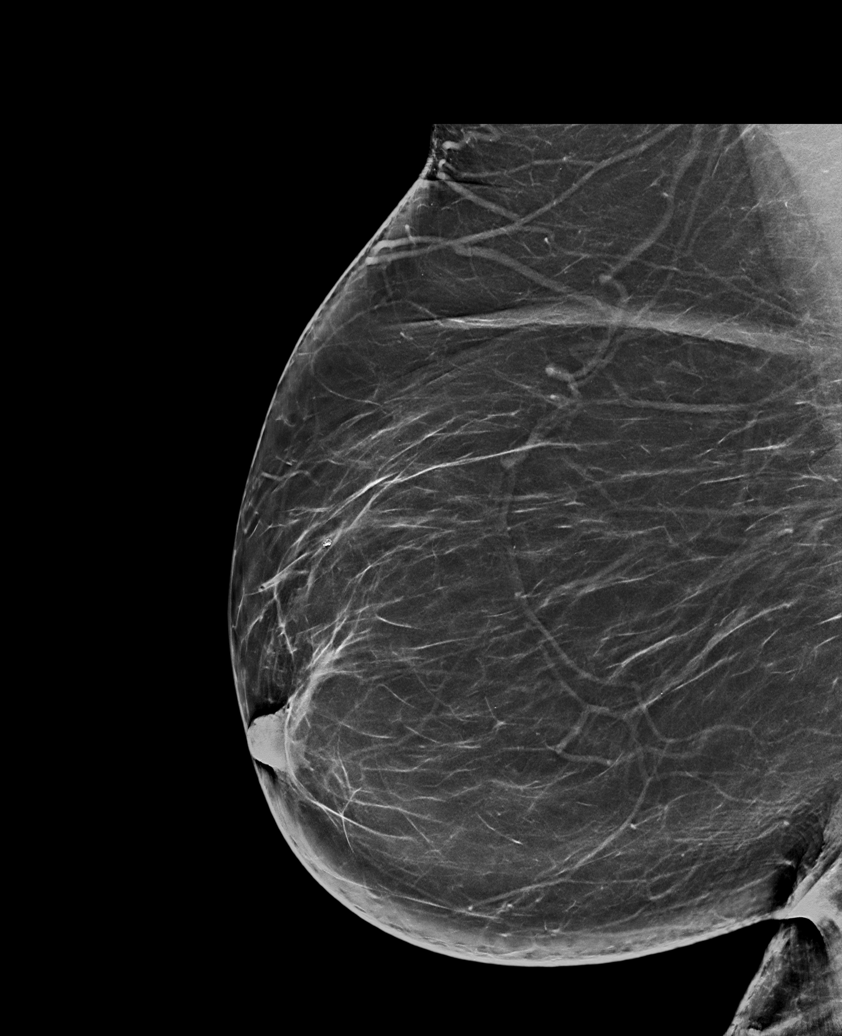

[R CC synth-2D]
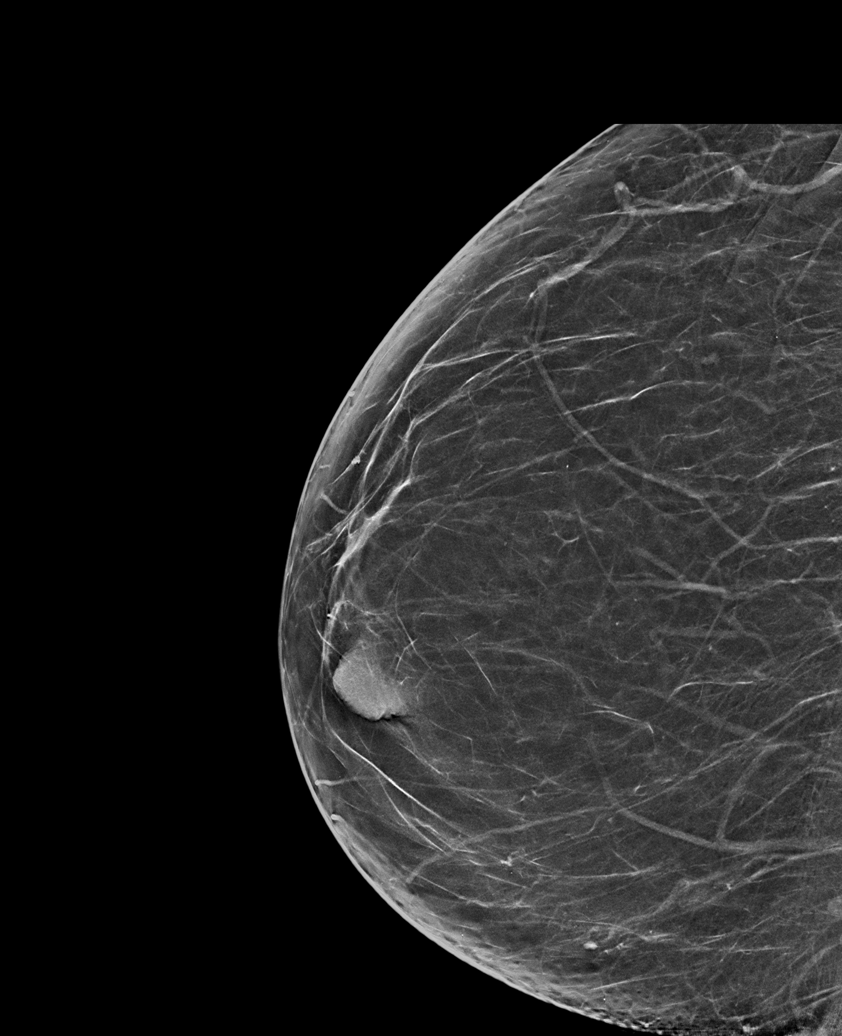

[L CC synth-2D]
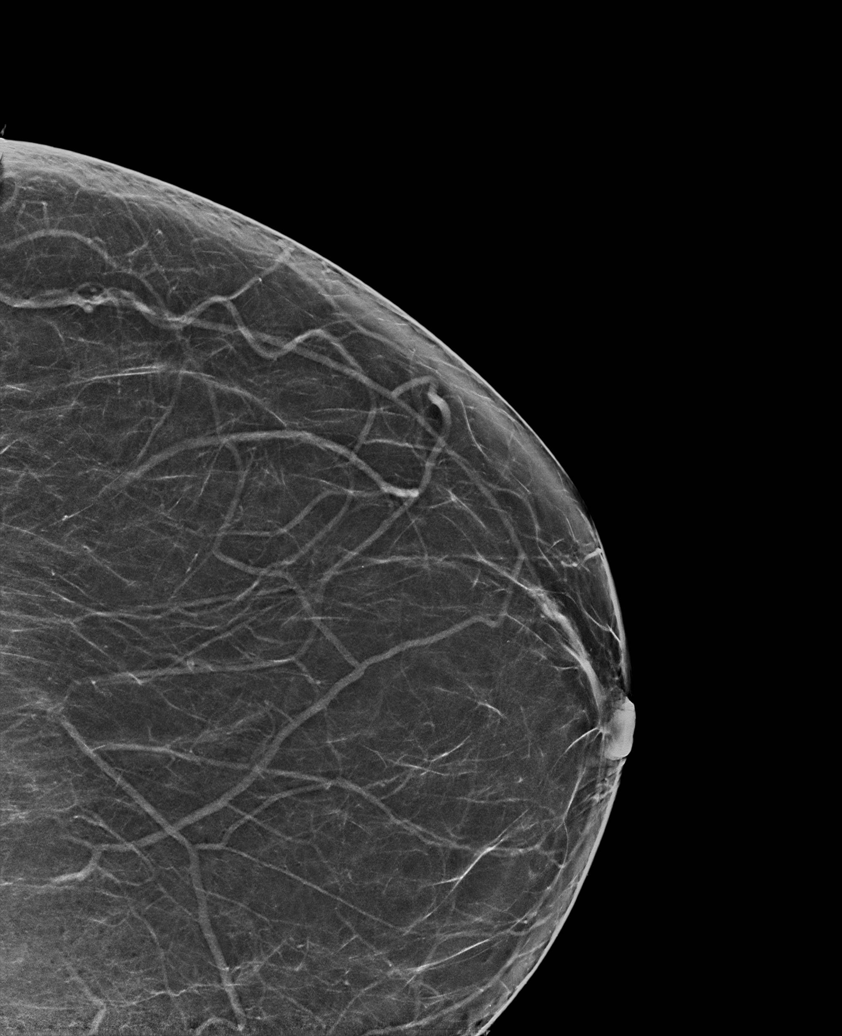

[R CV synth-2D]
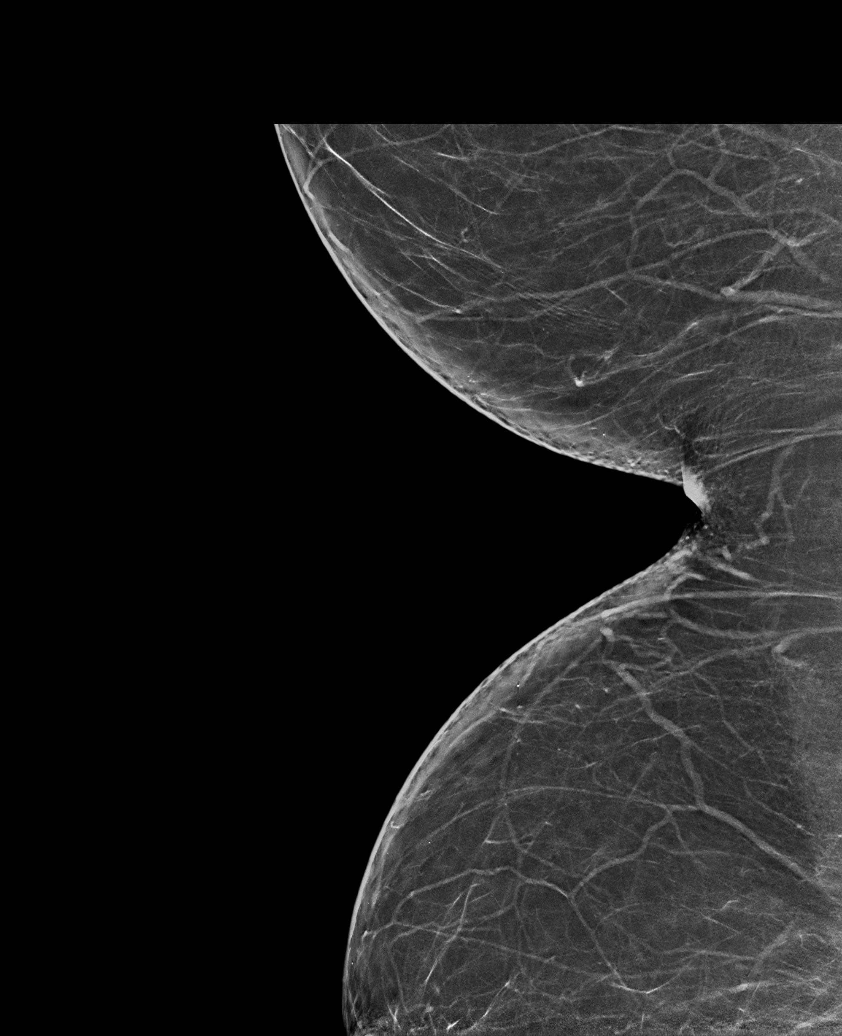

[L MLO synth-2D]
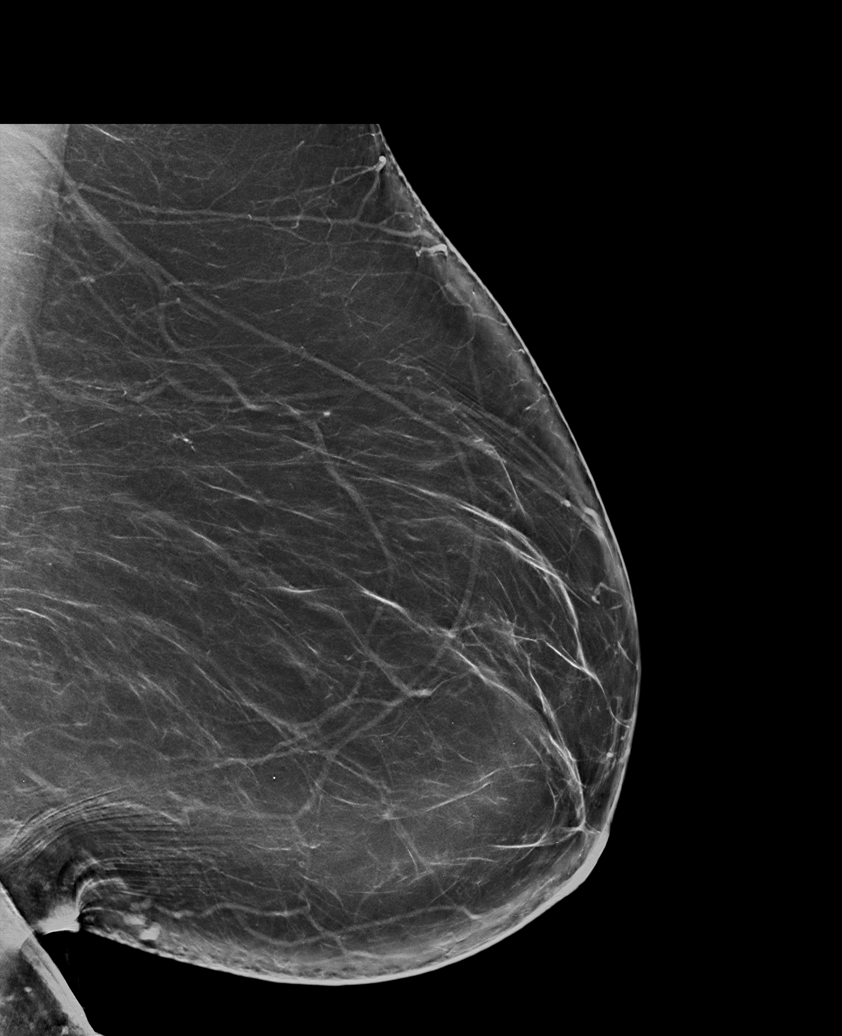

[R MLO synth-2D (2 of 2)]
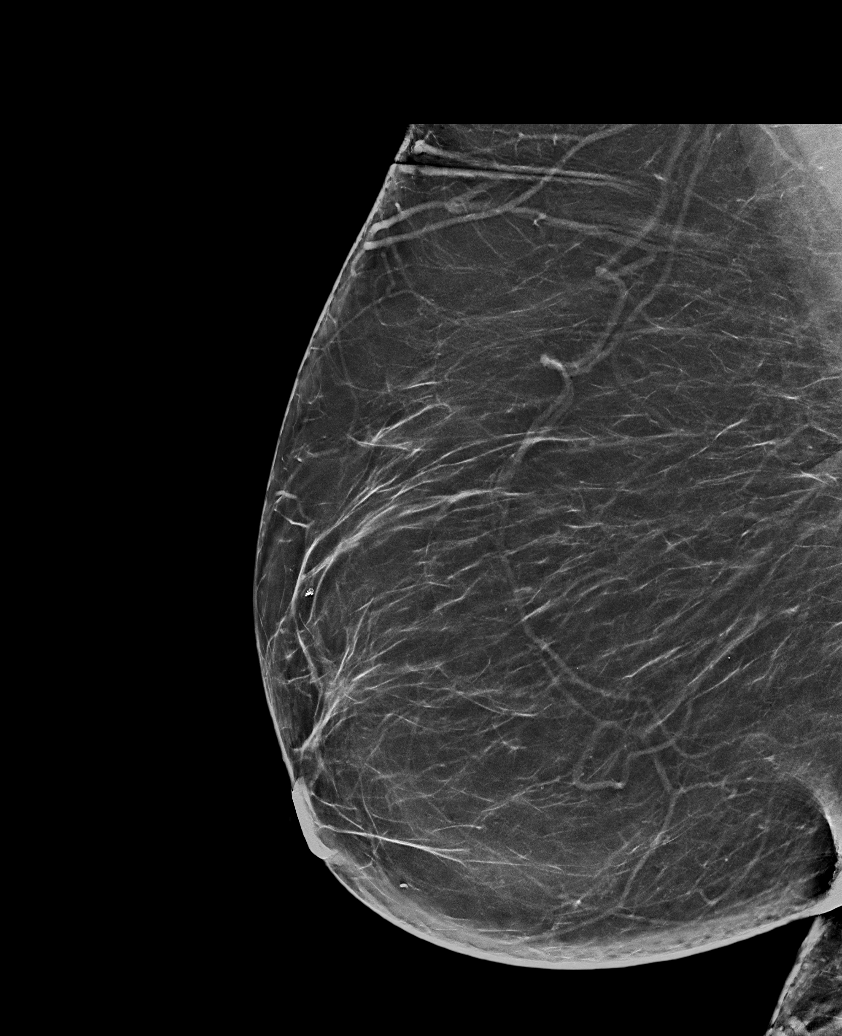

[6 of 36 positions shown; findings below may reference images not displayed]

ACR Breast Density Category b: There are scattered areas of
fibroglandular density.
FINDINGS: There are no findings suspicious for malignancy.
IMPRESSION: No mammographic evidence of malignancy. A result letter of this
screening mammogram will be mailed directly to the patient.

RECOMMENDATION:
Screening mammogram in one year. (Code:51-O-LD2)

BI-RADS CATEGORY  1: Negative.

## 2023-06-21 ENCOUNTER — Other Ambulatory Visit: Payer: Self-pay | Admitting: Internal Medicine

## 2023-06-29 ENCOUNTER — Encounter: Payer: Self-pay | Admitting: Internal Medicine

## 2023-07-07 ENCOUNTER — Other Ambulatory Visit: Payer: Self-pay | Admitting: Internal Medicine

## 2023-07-07 ENCOUNTER — Other Ambulatory Visit (HOSPITAL_COMMUNITY): Payer: Self-pay

## 2023-07-30 ENCOUNTER — Other Ambulatory Visit: Payer: Self-pay | Admitting: Internal Medicine

## 2023-08-04 ENCOUNTER — Other Ambulatory Visit: Payer: Self-pay | Admitting: Internal Medicine

## 2023-08-04 ENCOUNTER — Other Ambulatory Visit: Payer: Self-pay

## 2023-08-04 ENCOUNTER — Telehealth: Payer: Self-pay | Admitting: Pharmacy Technician

## 2023-08-04 ENCOUNTER — Encounter: Payer: Self-pay | Admitting: Internal Medicine

## 2023-08-04 ENCOUNTER — Other Ambulatory Visit (HOSPITAL_COMMUNITY): Payer: Self-pay

## 2023-08-04 NOTE — Telephone Encounter (Signed)
 Pharmacy Patient Advocate Encounter   Received notification from Patient Pharmacy that prior authorization for ZEPBOUND 15MG /0.5ML AUTO-INJECTORS is required/requested.   Insurance verification completed.   The patient is insured through [employer] .   Per test claim: PA required; However, NEW/RECENT labs/notes are needed to complete & submit PA request. Please see below.   Patient will need to come in for a weight check. We will have to submit the starting weight and the current weight to the insurance for reauthorization to be completed.

## 2023-08-05 ENCOUNTER — Other Ambulatory Visit (HOSPITAL_COMMUNITY): Payer: Self-pay

## 2023-08-05 ENCOUNTER — Other Ambulatory Visit: Payer: Self-pay

## 2023-08-05 MED ORDER — VALACYCLOVIR HCL 500 MG PO TABS: 500.0000 mg | ORAL_TABLET | ORAL | 1 refills | Status: DC | PRN

## 2023-08-05 MED ORDER — FLUTICASONE PROPIONATE 50 MCG/ACT NA SUSP: 1.0000 | Freq: Every day | NASAL | 3 refills | Status: DC

## 2023-08-06 ENCOUNTER — Other Ambulatory Visit (HOSPITAL_COMMUNITY): Payer: Self-pay

## 2023-08-06 NOTE — Telephone Encounter (Signed)
 Patient declined coming in for weigh check

## 2023-08-09 ENCOUNTER — Encounter (HOSPITAL_COMMUNITY): Payer: Self-pay

## 2023-08-10 ENCOUNTER — Other Ambulatory Visit (HOSPITAL_COMMUNITY): Payer: Self-pay

## 2023-08-10 ENCOUNTER — Encounter: Payer: Self-pay | Admitting: Internal Medicine

## 2023-08-10 NOTE — Telephone Encounter (Signed)
 SEE PHARMACY TELEPHONE ENCOUNTER FROM 08/04/2023

## 2023-08-12 ENCOUNTER — Other Ambulatory Visit (HOSPITAL_COMMUNITY): Payer: Self-pay

## 2023-08-12 ENCOUNTER — Ambulatory Visit

## 2023-08-12 ENCOUNTER — Telehealth: Payer: Self-pay | Admitting: Pharmacy Technician

## 2023-08-12 NOTE — Telephone Encounter (Signed)
 Pharmacy Patient Advocate Encounter  Received notification from [facility] that Prior Authorization for Zepbound 15MG /0.5ML pen-injectors has been APPROVED from 08/12/2023 to 08/11/2024. Ran test claim, Copay is $150.00. This test claim was processed through [facility]- copay amounts may vary at other pharmacies due to pharmacy/plan contracts, or as the patient moves through the different stages of their insurance plan.   PA #/Case ID/Reference #: [id number]

## 2023-08-12 NOTE — Telephone Encounter (Signed)
 Pharmacy Patient Advocate Encounter   Received notification from [facility] that prior authorization for Zepbound 15MG /0.5ML pen-injectors is required/requested.   Insurance verification completed.   The patient is insured through [employer] .   Per test claim: PA required; PA submitted to above mentioned insurance via CoverMyMeds Key/confirmation #/EOC [id number] Status is pending

## 2023-08-12 NOTE — Telephone Encounter (Signed)
 PA request has been Approved. New Encounter has been or will be created for follow up. For additional info see Pharmacy Prior Auth telephone encounter from 08/12/2023.

## 2023-08-22 ENCOUNTER — Encounter: Payer: Self-pay | Admitting: Internal Medicine

## 2023-08-23 ENCOUNTER — Other Ambulatory Visit: Payer: Self-pay

## 2023-08-23 DIAGNOSIS — I10 Essential (primary) hypertension: Secondary | ICD-10-CM

## 2023-08-23 MED ORDER — CHLORTHALIDONE 25 MG PO TABS: 25.0000 mg | ORAL_TABLET | Freq: Every day | ORAL | 0 refills | Status: DC

## 2023-08-23 NOTE — Telephone Encounter (Signed)
 Refills sent

## 2023-08-28 ENCOUNTER — Other Ambulatory Visit: Payer: Self-pay | Admitting: Medical Genetics

## 2023-09-02 ENCOUNTER — Other Ambulatory Visit (HOSPITAL_COMMUNITY): Payer: Self-pay

## 2023-09-03 ENCOUNTER — Other Ambulatory Visit (HOSPITAL_COMMUNITY): Payer: Self-pay

## 2023-09-03 ENCOUNTER — Other Ambulatory Visit (HOSPITAL_COMMUNITY)
Admission: RE | Admit: 2023-09-03 | Discharge: 2023-09-03 | Disposition: A | Discharge status: Home / Self Care | Payer: Self-pay | Source: Ambulatory Visit | Attending: Medical Genetics | Admitting: Medical Genetics

## 2023-09-03 ENCOUNTER — Encounter: Payer: Self-pay | Admitting: Internal Medicine

## 2023-09-03 ENCOUNTER — Ambulatory Visit (INDEPENDENT_AMBULATORY_CARE_PROVIDER_SITE_OTHER): Payer: Commercial Managed Care - PPO | Admitting: Internal Medicine

## 2023-09-03 ENCOUNTER — Other Ambulatory Visit: Payer: Self-pay

## 2023-09-03 VITALS — BP 117/76 | HR 81 | Ht 64.0 in | Wt 278.8 lb

## 2023-09-03 DIAGNOSIS — E7800 Pure hypercholesterolemia, unspecified: Secondary | ICD-10-CM | POA: Diagnosis not present

## 2023-09-03 DIAGNOSIS — E039 Hypothyroidism, unspecified: Secondary | ICD-10-CM

## 2023-09-03 DIAGNOSIS — K5900 Constipation, unspecified: Secondary | ICD-10-CM

## 2023-09-03 DIAGNOSIS — I10 Essential (primary) hypertension: Secondary | ICD-10-CM | POA: Diagnosis not present

## 2023-09-03 DIAGNOSIS — Z114 Encounter for screening for human immunodeficiency virus [HIV]: Secondary | ICD-10-CM

## 2023-09-03 DIAGNOSIS — Z1159 Encounter for screening for other viral diseases: Secondary | ICD-10-CM

## 2023-09-03 MED ORDER — ZEPBOUND 15 MG/0.5ML ~~LOC~~ SOAJ: 15.0000 mg | SUBCUTANEOUS | 1 refills | Status: DC

## 2023-09-03 MED ORDER — PRAVASTATIN SODIUM 40 MG PO TABS: 40.0000 mg | ORAL_TABLET | Freq: Every day | ORAL | 3 refills | Status: DC

## 2023-09-03 MED ORDER — DILTIAZEM HCL ER 240 MG PO CP24: 240.0000 mg | ORAL_CAPSULE | Freq: Every day | ORAL | 3 refills | Status: AC

## 2023-09-03 MED ORDER — ZEPBOUND 15 MG/0.5ML ~~LOC~~ SOAJ
15.0000 mg | SUBCUTANEOUS | 1 refills | Status: DC
  Filled 2023-09-03 – 2023-09-06 (×3): qty 6, 84d supply, fill #0
  Filled 2023-11-20 – 2023-12-02 (×2): qty 2, 28d supply, fill #1
  Filled 2023-12-09: qty 6, 84d supply, fill #1

## 2023-09-03 MED ORDER — CHLORTHALIDONE 25 MG PO TABS: 25.0000 mg | ORAL_TABLET | Freq: Every day | ORAL | 3 refills | Status: DC

## 2023-09-03 MED ORDER — LISINOPRIL 40 MG PO TABS: 40.0000 mg | ORAL_TABLET | Freq: Every day | ORAL | 3 refills | Status: DC

## 2023-09-03 NOTE — Assessment & Plan Note (Signed)
 Currently prescribed levothyroxine  88 mcg daily.  Repeat thyroid  studies ordered today.

## 2023-09-03 NOTE — Assessment & Plan Note (Signed)
Remains adequately controlled on current antihypertensive regimen.  No medication changes are indicated today.  Refills provided.

## 2023-09-03 NOTE — Assessment & Plan Note (Signed)
Currently prescribed pravastatin 40 mg daily.  Repeat lipid panel ordered today.

## 2023-09-03 NOTE — Assessment & Plan Note (Signed)
 Current weight 278 pounds.  BMI 47.8.  She has lost 47 pounds since starting Zepbound  in July 2024.  Currently prescribed Zepbound  15 mg weekly.  She is pleased with the progress and is once again encouraged to maintain lifestyle modifications and weight loss.  Repeat labs ordered today.

## 2023-09-03 NOTE — Assessment & Plan Note (Signed)
 Chronic issue, worse in recent months.  Using stool softeners as needed.  Recommend increasing daily fiber intake.  She will return to care if symptoms worsen or fail to improve.

## 2023-09-03 NOTE — Progress Notes (Signed)
 Established Patient Office Visit  Subjective   Patient ID: Jacqueline Parsons, female    DOB: 1970-12-22  Age: 53 y.o. MRN: [id number]  Chief Complaint  Patient presents with   blood work     Requesting blood work    Constipation    Patient states she stays constipated    Obesity    Follow up for zepbound    Jacqueline Parsons returns to care today for follow-up.  She was last evaluated by me in October 2024 for follow-up of HTN.  No additional medication changes were made at that time and 2-month follow-up was arranged.  In the interim, she presented to urgent care endorsing a cough with sputum production and wheezing.  Treated empirically for pneumonia with Augmentin  and systemic steroids.  There have otherwise been no acute interval events.  Jacqueline Parsons reports feeling fairly well today.  She endorses constipation and requests treatment recommendations.  She does not have any additional concerns to discuss.  Past Medical History:  Diagnosis Date   Allergy  1996   Anxiety    Cervical cancer (HCC) 2002   Depression    Gastric bypass status for obesity 2015   Hypertension    Oxygen deficiency    Sleep apnea 2007   Thyroid  disease    Vaginal Pap smear, abnormal    Past Surgical History:  Procedure Laterality Date   ABDOMINAL HYSTERECTOMY     arm reduction Bilateral 2021   CARPAL TUNNEL RELEASE     ROUX-EN-Y PROCEDURE  2015   Social History   Tobacco Use   Smoking status: Never    Passive exposure: Never   Smokeless tobacco: Never  Vaping Use   Vaping status: Never Used  Substance Use Topics   Alcohol use: Yes    Comment: occ   Drug use: No   Family History  Problem Relation Age of Onset   Alcohol abuse Mother    Alcohol abuse Father    Hypertension Maternal Aunt    Alcoholism Maternal Grandmother    Cancer Maternal Grandfather        lung   Thyroid  disease Neg Hx    Colon cancer Neg Hx    Colon polyps Neg Hx    Allergic rhinitis Neg Hx    Angioedema Neg Hx     Asthma Neg Hx    Eczema Neg Hx    Urticaria Neg Hx    Allergies  Allergen Reactions   Dust Mite Extract Cough, Hives and Itching   Review of Systems  Constitutional:  Negative for chills and fever.  HENT:  Negative for sore throat.   Respiratory:  Negative for cough and shortness of breath.   Cardiovascular:  Negative for chest pain, palpitations and leg swelling.  Gastrointestinal:  Positive for constipation. Negative for abdominal pain, blood in stool, diarrhea, nausea and vomiting.  Genitourinary:  Negative for dysuria and hematuria.  Musculoskeletal:  Negative for myalgias.  Skin:  Negative for itching and rash.  Neurological:  Negative for dizziness and headaches.  Psychiatric/Behavioral:  Negative for depression and suicidal ideas.      Objective:     BP 117/76   Pulse 81   Ht 5\' 4"  (1.626 m)   Wt 278 lb 12.8 oz (126.5 kg)   SpO2 98%   BMI 47.86 kg/m  BP Readings from Last 3 Encounters:  09/03/23 117/76  04/09/23 (!) 145/93  02/22/23 138/86   Physical Exam Vitals reviewed.  Constitutional:      General: She is  not in acute distress.    Appearance: Normal appearance. She is obese. She is not toxic-appearing.  HENT:     Head: Normocephalic and atraumatic.     Right Ear: External ear normal.     Left Ear: External ear normal.     Nose: Nose normal. No congestion or rhinorrhea.     Mouth/Throat:     Mouth: Mucous membranes are moist.     Pharynx: Oropharynx is clear. No oropharyngeal exudate or posterior oropharyngeal erythema.  Eyes:     General: No scleral icterus.    Extraocular Movements: Extraocular movements intact.     Conjunctiva/sclera: Conjunctivae normal.     Pupils: Pupils are equal, round, and reactive to light.  Cardiovascular:     Rate and Rhythm: Normal rate and regular rhythm.     Pulses: Normal pulses.     Heart sounds: Normal heart sounds. No murmur heard.    No friction rub. No gallop.  Pulmonary:     Effort: Pulmonary effort is  normal.     Breath sounds: Normal breath sounds. No wheezing, rhonchi or rales.  Abdominal:     General: Abdomen is flat. Bowel sounds are normal. There is no distension.     Palpations: Abdomen is soft.     Tenderness: There is no abdominal tenderness.  Musculoskeletal:        General: No swelling. Normal range of motion.     Cervical back: Normal range of motion.     Right lower leg: No edema.     Left lower leg: No edema.  Lymphadenopathy:     Cervical: No cervical adenopathy.  Skin:    General: Skin is warm and dry.     Capillary Refill: Capillary refill takes less than 2 seconds.     Coloration: Skin is not jaundiced.  Neurological:     General: No focal deficit present.     Mental Status: She is alert and oriented to person, place, and time.  Psychiatric:        Mood and Affect: Mood normal.        Behavior: Behavior normal.   Last CBC Lab Results  Component Value Date   WBC 5.2 07/19/2017   HGB 12.3 07/19/2017   HCT 39.9 07/19/2017   MCV 84.4 07/19/2017   MCH 26.0 07/19/2017   RDW 15.5 07/19/2017   PLT 225 07/19/2017   Last metabolic panel Lab Results  Component Value Date   GLUCOSE 92 07/19/2017   NA 139 07/19/2017   K 3.3 (L) 07/19/2017   CL 100 (L) 07/19/2017   CO2 29 07/19/2017   BUN 16 07/19/2017   CREATININE 0.78 07/19/2017   GFRNONAA >60 07/19/2017   CALCIUM 9.6 07/19/2017   PROT 7.2 07/19/2017   ALBUMIN 4.0 07/19/2017   BILITOT 0.1 (L) 07/19/2017   ALKPHOS 60 07/19/2017   AST 24 07/19/2017   ALT 20 07/19/2017   ANIONGAP 10 07/19/2017   Last thyroid  functions Lab Results  Component Value Date   TSH 2.24 10/13/2017   The 10-year ASCVD risk score (Arnett DK, et al., 2019) is: 1.7%    Assessment & Plan:   Problem List Items Addressed This Visit       Essential hypertension   Remains adequately controlled on current antihypertensive regimen.  No medication changes are indicated today.  Refills provided.      Adult hypothyroidism    Currently prescribed levothyroxine  88 mcg daily.  Repeat thyroid  studies ordered today.  Hyperlipidemia - Primary   Currently prescribed pravastatin  40 mg daily.  Repeat lipid panel ordered today.      Morbid obesity (HCC)   Current weight 278 pounds.  BMI 47.8.  She has lost 47 pounds since starting Zepbound  in July 2024.  Currently prescribed Zepbound  15 mg weekly.  She is pleased with the progress and is once again encouraged to maintain lifestyle modifications and weight loss.  Repeat labs ordered today.      Constipation   Chronic issue, worse in recent months.  Using stool softeners as needed.  Recommend increasing daily fiber intake.  She will return to care if symptoms worsen or fail to improve.      Return in about 6 months (around 03/04/2024).   Tobi Fortes, MD

## 2023-09-03 NOTE — Patient Instructions (Signed)
 It was a pleasure to see you today.  Thank you for giving us  the opportunity to be involved in your care.  Below is a brief recap of your visit and next steps.  We will plan to see you again in 6 months.  Summary No medication changes today. Refills provided Repeat labs ordered Keep up the great work with weight loss I recommend increasing your daily fiber intake to help with constipation Follow up in 6 months

## 2023-09-04 ENCOUNTER — Encounter: Payer: Self-pay | Admitting: Internal Medicine

## 2023-09-04 LAB — CBC WITH DIFFERENTIAL/PLATELET
Basophils Absolute: 0 10*3/uL (ref 0.0–0.2)
Basos: 1 %
EOS (ABSOLUTE): 0.1 10*3/uL (ref 0.0–0.4)
Eos: 1 %
Hematocrit: 36.7 % (ref 34.0–46.6)
Hemoglobin: 11.6 g/dL (ref 11.1–15.9)
Immature Grans (Abs): 0 10*3/uL (ref 0.0–0.1)
Immature Granulocytes: 0 %
Lymphocytes Absolute: 1.7 10*3/uL (ref 0.7–3.1)
Lymphs: 33 %
MCH: 27 pg (ref 26.6–33.0)
MCHC: 31.6 g/dL (ref 31.5–35.7)
MCV: 86 fL (ref 79–97)
Monocytes Absolute: 0.4 10*3/uL (ref 0.1–0.9)
Monocytes: 8 %
Neutrophils Absolute: 2.9 10*3/uL (ref 1.4–7.0)
Neutrophils: 57 %
Platelets: 290 10*3/uL (ref 150–450)
RBC: 4.29 x10E6/uL (ref 3.77–5.28)
RDW: 14.5 % (ref 11.7–15.4)
WBC: 5.2 10*3/uL (ref 3.4–10.8)

## 2023-09-04 LAB — CMP14+EGFR
ALT: 16 IU/L (ref 0–32)
AST: 21 IU/L (ref 0–40)
Albumin: 4 g/dL (ref 3.9–4.9)
Alkaline Phosphatase: 65 IU/L (ref 44–121)
BUN/Creatinine Ratio: 11 (ref 9–23)
BUN: 12 mg/dL (ref 6–24)
Bilirubin Total: 0.4 mg/dL (ref 0.0–1.2)
CO2: 27 mmol/L (ref 20–29)
Calcium: 9.4 mg/dL (ref 8.7–10.2)
Chloride: 100 mmol/L (ref 96–106)
Creatinine, Ser: 1.11 mg/dL — ABNORMAL HIGH (ref 0.57–1.00)
Globulin, Total: 2.6 g/dL (ref 1.5–4.5)
Glucose: 76 mg/dL (ref 70–99)
Potassium: 3.9 mmol/L (ref 3.5–5.2)
Sodium: 141 mmol/L (ref 134–144)
Total Protein: 6.6 g/dL (ref 6.0–8.5)
eGFR: 61 mL/min/{1.73_m2} (ref >59)

## 2023-09-04 LAB — LIPID PANEL
Chol/HDL Ratio: 2.8 ratio (ref 0.0–4.4)
Cholesterol, Total: 133 mg/dL (ref 100–199)
HDL: 48 mg/dL (ref >39)
LDL Chol Calc (NIH): 73 mg/dL (ref 0–99)
Triglycerides: 57 mg/dL (ref 0–149)
VLDL Cholesterol Cal: 12 mg/dL (ref 5–40)

## 2023-09-04 LAB — HIV ANTIBODY (ROUTINE TESTING W REFLEX): HIV Screen 4th Generation wRfx: NONREACTIVE

## 2023-09-04 LAB — B12 AND FOLATE PANEL
Folate: 12.7 ng/mL (ref >3.0)
Vitamin B-12: 579 pg/mL (ref 232–1245)

## 2023-09-04 LAB — HEMOGLOBIN A1C
Est. average glucose Bld gHb Est-mCnc: 103 mg/dL
Hgb A1c MFr Bld: 5.2 % (ref 4.8–5.6)

## 2023-09-04 LAB — HCV INTERPRETATION

## 2023-09-04 LAB — TSH+FREE T4
Free T4: 1.4 ng/dL (ref 0.82–1.77)
TSH: 1.67 u[IU]/mL (ref 0.450–4.500)

## 2023-09-04 LAB — HCV AB W REFLEX TO QUANT PCR: HCV Ab: NONREACTIVE

## 2023-09-04 LAB — VITAMIN D 25 HYDROXY (VIT D DEFICIENCY, FRACTURES): Vit D, 25-Hydroxy: 41.9 ng/mL (ref 30.0–100.0)

## 2023-09-06 ENCOUNTER — Other Ambulatory Visit (HOSPITAL_COMMUNITY): Payer: Self-pay

## 2023-09-06 ENCOUNTER — Encounter: Payer: Self-pay | Admitting: Internal Medicine

## 2023-09-13 LAB — GENECONNECT MOLECULAR SCREEN: Genetic Analysis Overall Interpretation: NEGATIVE

## 2023-10-05 ENCOUNTER — Other Ambulatory Visit: Payer: Self-pay | Admitting: Internal Medicine

## 2023-10-14 ENCOUNTER — Encounter: Payer: Self-pay | Admitting: Internal Medicine

## 2023-10-28 ENCOUNTER — Encounter (HOSPITAL_BASED_OUTPATIENT_CLINIC_OR_DEPARTMENT_OTHER): Payer: Self-pay

## 2023-10-28 ENCOUNTER — Telehealth (HOSPITAL_BASED_OUTPATIENT_CLINIC_OR_DEPARTMENT_OTHER): Payer: Self-pay | Admitting: Primary Care

## 2023-10-29 ENCOUNTER — Ambulatory Visit (HOSPITAL_BASED_OUTPATIENT_CLINIC_OR_DEPARTMENT_OTHER): Admitting: Primary Care

## 2023-10-29 ENCOUNTER — Encounter (HOSPITAL_BASED_OUTPATIENT_CLINIC_OR_DEPARTMENT_OTHER): Payer: Self-pay | Admitting: Primary Care

## 2023-10-29 VITALS — BP 115/93 | HR 80 | Ht 64.0 in | Wt 272.5 lb

## 2023-10-29 DIAGNOSIS — Z9989 Dependence on other enabling machines and devices: Secondary | ICD-10-CM

## 2023-10-29 DIAGNOSIS — G4730 Sleep apnea, unspecified: Secondary | ICD-10-CM | POA: Diagnosis not present

## 2023-10-29 NOTE — Progress Notes (Signed)
 Epworth Sleepiness Scale  Use the following scale to choose the most appropriate number for each situation. 0 Would never nod off 1  Slight  chance of nodding off 2 Moderate chance of nodding off 3 High chance of nodding off  Sitting and reading: 2 Watching TV: 0 Sitting, inactive, in a public place (e.g., in a meeting, theater, or dinner event): 0 As a passenger in a car for an hour or more without stopping for a break: 3 Lying down to rest when circumstances permit:3 Sitting and talking to someone: 0 Sitting quietly after a meal without alcohol: 2 In a car, while stopped for a few  minutes in traffic or at a light: 0  TOTOAL: 10

## 2023-10-29 NOTE — Progress Notes (Signed)
 @Patient  ID: Jacqueline Parsons, female    DOB: 01-11-1971, 53 y.o.   MRN: [id number]  Chief Complaint  Patient presents with   Establish Care    Sleep consult, already has CPAP    Referring provider: Tobi Fortes, MD  HPI: 53 year old female, never smoked.  Past history significant for hypertension, thyroid  nodule, hypothyroidism, dysphagia, anxiety/depression, hyperlipidemia, history of cervical cancer.  10/29/2023 Discussed the use of AI scribe software for clinical note transcription with the patient, who gave verbal consent to proceed.  History of Present Illness   Jacqueline Parsons is a 53 year old female with obstructive sleep apnea who presents for a sleep consult to establish care.  She was diagnosed with obstructive sleep apnea approximately twelve years ago through a sleep study conducted in a lab setting. She uses a CPAP machine with auto settings starting at a pressure of five, which can ramp up as needed. Her current machine was provided as a replacement following a recall of her initial device. She believes she may need a new machine as she has had the current one for a long time.  Her typical sleep schedule involves going to bed between 10:30 and 11:00 PM, taking about ten minutes to fall asleep, and waking up once or twice during the night. She starts her day at 6:45 AM. Without the CPAP machine, she experiences significant snoring, difficulty breathing, restless sleep, and daytime sleepiness. However, with the CPAP, she feels much better, with improved concentration and reduced daytime sleepiness.  She has been on Zepbound  for approximately nine months and has lost about thirty-five pounds. She is currently on a maintenance dose of fifteen. Her sleep study data is not currently accessible through her app, [employer], due to a lack of Bluetooth permission, and the last recorded data was from August 24.  She lives in [place] and is familiar with local medical supply  stores such as [facility]. No use of oxygen at night and the CPAP machine has been effective in managing her symptoms.       Allergies  Allergen Reactions   Dust Mite Extract Cough, Hives and Itching    Immunization History  Administered Date(s) Administered   PFIZER(Purple Top)SARS-COV-2 Vaccination 07/23/2019, 08/13/2019, 04/26/2020   Tdap 04/24/2015    Past Medical History:  Diagnosis Date   Allergy  1996   Anxiety    Cervical cancer (HCC) 2002   Depression    Gastric bypass status for obesity 2015   Hypertension    Oxygen deficiency    Sleep apnea 2007   Thyroid  disease    Vaginal Pap smear, abnormal     Tobacco History: Social History   Tobacco Use  Smoking Status Never   Passive exposure: Never  Smokeless Tobacco Never   Counseling given: Not Answered   Outpatient Medications Prior to Visit  Medication Sig Dispense Refill   albuterol  (VENTOLIN  HFA) 108 (90 Base) MCG/ACT inhaler Inhale 2 puffs into the lungs every 6 (six) hours as needed for wheezing or shortness of breath. 8 g 0   Ascorbic Acid (VITAMIN C PO) Take by mouth.     calcium gluconate 500 MG tablet Take 1 tablet by mouth daily.     cetirizine  (ZYRTEC ) 10 MG tablet Take 1 tablet (10 mg total) by mouth at bedtime. 90 tablet 3   chlorthalidone  (HYGROTON ) 25 MG tablet Take 1 tablet (25 mg total) by mouth daily. 90 tablet 3   Cholecalciferol (VITAMIN D3) 5000 units TABS Take by mouth daily.  diltiazem  (DILT-XR) 240 MG 24 hr capsule Take 1 capsule (240 mg total) by mouth daily. 90 capsule 3   diphenhydrAMINE HCl (BENADRYL PO) Take by mouth.     famotidine  (PEPCID ) 20 MG tablet Take 1 tablet (20 mg total) by mouth at bedtime. 90 tablet 3   fluticasone  (FLONASE ) 50 MCG/ACT nasal spray Place 1 spray into both nostrils daily. 15.8 mL 3   levothyroxine  (SYNTHROID ) 88 MCG tablet TAKE 1 TABLET(88 MCG) BY MOUTH DAILY 30 tablet 3   lisinopril  (ZESTRIL ) 40 MG tablet Take 1 tablet (40 mg total) by  mouth daily. 90 tablet 3   pediatric multivitamin-iron (POLY-VI-SOL WITH IRON) 15 MG chewable tablet Chew 1 tablet by mouth daily.     pravastatin  (PRAVACHOL ) 40 MG tablet Take 1 tablet (40 mg total) by mouth daily. 90 tablet 3   tirzepatide  (ZEPBOUND ) 15 MG/0.5ML Pen Inject 15 mg into the skin once a week. 6 mL 1   tiZANidine  (ZANAFLEX ) 4 MG capsule Take 1 capsule (4 mg total) by mouth 3 (three) times daily as needed for muscle spasms. Do not drink alcohol or drive while taking this medication.  May cause drowsiness. 15 capsule 0   valACYclovir  (VALTREX ) 500 MG tablet Take 1 tablet (500 mg total) by mouth as needed. 30 tablet 1   vitamin B-12 (CYANOCOBALAMIN) 500 MCG tablet Take by mouth.     benzonatate  (TESSALON  PERLES) 100 MG capsule Take 1 capsule (100 mg total) by mouth 3 (three) times daily as needed for cough. (Patient not taking: Reported on 10/29/2023) 20 capsule 0   promethazine -dextromethorphan (PROMETHAZINE -DM) 6.25-15 MG/5ML syrup Take 5 mLs by mouth 4 (four) times daily as needed. (Patient not taking: Reported on 10/29/2023) 118 mL 0   No facility-administered medications prior to visit.   Review of Systems  Review of Systems  Constitutional: Negative.   HENT: Negative.    Respiratory: Negative.    Cardiovascular: Negative.   Psychiatric/Behavioral: Negative.  Negative for sleep disturbance.     Physical Exam  BP (!) 115/93   Pulse 80   Ht 5' 4 (1.626 m)   SpO2 100%   BMI 47.86 kg/m  Physical Exam Constitutional:      General: She is not in acute distress.    Appearance: Normal appearance. She is obese. She is not ill-appearing.  HENT:     Head: Normocephalic and atraumatic.     Mouth/Throat:     Mouth: Mucous membranes are moist.     Pharynx: Oropharynx is clear.   Cardiovascular:     Rate and Rhythm: Normal rate and regular rhythm.  Pulmonary:     Effort: Pulmonary effort is normal.     Breath sounds: Normal breath sounds. No wheezing or rhonchi.    Neurological:     General: No focal deficit present.     Mental Status: She is alert and oriented to person, place, and time. Mental status is at baseline.   Psychiatric:        Mood and Affect: Mood normal.        Behavior: Behavior normal.        Thought Content: Thought content normal.        Judgment: Judgment normal.      Lab Results:  CBC    Component Value Date/Time   WBC 5.2 09/03/2023 0904   WBC 5.2 07/19/2017 1510   RBC 4.29 09/03/2023 0904   RBC 4.73 07/19/2017 1510   HGB 11.6 09/03/2023 0904   HCT 36.7 09/03/2023  0904   PLT 290 09/03/2023 0904   MCV 86 09/03/2023 0904   MCH 27.0 09/03/2023 0904   MCH 26.0 07/19/2017 1510   MCHC 31.6 09/03/2023 0904   MCHC 30.8 07/19/2017 1510   RDW 14.5 09/03/2023 0904   LYMPHSABS 1.7 09/03/2023 0904   MONOABS 0.5 07/19/2017 1510   EOSABS 0.1 09/03/2023 0904   BASOSABS 0.0 09/03/2023 0904    BMET    Component Value Date/Time   NA 141 09/03/2023 0904   K 3.9 09/03/2023 0904   CL 100 09/03/2023 0904   CO2 27 09/03/2023 0904   GLUCOSE 76 09/03/2023 0904   GLUCOSE 92 07/19/2017 1510   BUN 12 09/03/2023 0904   CREATININE 1.11 (H) 09/03/2023 0904   CALCIUM 9.4 09/03/2023 0904   GFRNONAA >60 07/19/2017 1510   GFRAA >60 07/19/2017 1510    BNP No results found for: BNP  ProBNP No results found for: PROBNP  Imaging: No results found.   Assessment & Plan:   No problem-specific Assessment & Plan notes found for this encounter.  Assessment and Plan    Obstructive Sleep Apnea Hx severe obstructive sleep apnea with symptoms of snoring, restless sleep, and daytime sleepiness without CPAP, significantly improved with CPAP use. Current CPAP machine is outdated. Previous sleep study conducted 12 years ago (no copy on file), compliance data not available for review. Significant weight loss of 35 pounds since may affect current status. A new sleep study is necessary to reassess condition and update treatment. -  Order home sleep study to reconfirm diagnosis. Based on results we will order patient a replacement CPAP machine if deemed necessary through [facility] in [place]  - Schedule check-in within 90 days of receiving new CPAP machine for insurance compliance. - Schedule annual check-ins to renew CPAP supply orders. - Advise pairing current CPAP machine with app for data retrieval. - Provide email contact for sending CPAP data if retrieved.   Antonio Baumgarten, NP 10/29/2023

## 2023-10-29 NOTE — Patient Instructions (Addendum)
 -  OBSTRUCTIVE SLEEP APNEA: Obstructive sleep apnea is a condition where your airway becomes blocked during sleep, causing snoring, restless sleep, and daytime sleepiness. Your CPAP machine has been effective in managing these symptoms, but it is outdated. We will order a home sleep study to reassess your condition and determine if you need a new CPAP machine. After the sleep study, we will order a new CPAP machine if necessary. You should visit [facility] in [place] for your medical supplies. We will schedule a check-in within 90 days of receiving your new CPAP machine to ensure compliance with insurance requirements and annual check-ins to renew your CPAP supply orders. Please pair your current CPAP machine with the app to retrieve data, and you can send the data via email if needed.  INSTRUCTIONS: Please complete the home sleep study and send me a mychart 2-3 weeks after completing results and order for new machine if needed. Schedule a check-in within 90 days of receiving your new CPAP machine and annual check-ins thereafter. Pair your current CPAP machine with the app to retrieve data, and send the data via email if you can.   Orders: Home sleep test  Follow-up 31-90 days after receiving new CPAP for compliance check/ then annually

## 2023-11-01 ENCOUNTER — Ambulatory Visit: Admitting: Orthopedic Surgery

## 2023-11-01 NOTE — Telephone Encounter (Signed)
 CPAP information as requested at her visit last week is attached

## 2023-11-02 NOTE — Telephone Encounter (Signed)
 Patient is 99% complaint with CPAP use over the last 90 days. Average usage > 7 hours. Average AHI <2.5/hour. Repeat HST to confirm diagnosis to get replacement CPAP machine. Notify us  when completed

## 2023-11-08 ENCOUNTER — Ambulatory Visit: Admitting: Orthopedic Surgery

## 2023-11-11 ENCOUNTER — Encounter

## 2023-11-11 DIAGNOSIS — G4730 Sleep apnea, unspecified: Secondary | ICD-10-CM

## 2023-11-22 ENCOUNTER — Other Ambulatory Visit (HOSPITAL_COMMUNITY): Payer: Self-pay

## 2023-11-23 ENCOUNTER — Other Ambulatory Visit (HOSPITAL_COMMUNITY): Payer: Self-pay

## 2023-11-23 DIAGNOSIS — G4730 Sleep apnea, unspecified: Secondary | ICD-10-CM | POA: Diagnosis not present

## 2023-11-30 ENCOUNTER — Encounter (HOSPITAL_BASED_OUTPATIENT_CLINIC_OR_DEPARTMENT_OTHER): Payer: Self-pay

## 2023-11-30 NOTE — Telephone Encounter (Signed)
 Please advise as I do not sleep study has been advised on

## 2023-11-30 NOTE — Telephone Encounter (Signed)
 Yes she has mild OSA, she had average of 9 pauses per hour. If she is symptomatic with excessive daytime sleepiness or her sleep is very disrupted then we can order auto CPAP 5-15cm h20 with mask of choice and FU in 31-90 days for compliance check. If no symptoms can monitor and work on weight loss and focus on side sleeping position.

## 2023-12-01 ENCOUNTER — Encounter (HOSPITAL_COMMUNITY): Payer: Self-pay

## 2023-12-02 ENCOUNTER — Telehealth: Payer: Self-pay

## 2023-12-02 ENCOUNTER — Telehealth: Payer: Self-pay | Admitting: Pharmacy Technician

## 2023-12-02 ENCOUNTER — Telehealth (HOSPITAL_COMMUNITY): Payer: Self-pay

## 2023-12-02 ENCOUNTER — Other Ambulatory Visit (HOSPITAL_COMMUNITY): Payer: Self-pay

## 2023-12-02 NOTE — Telephone Encounter (Signed)
 Pharmacy Patient Advocate Encounter   Received notification from Patient Pharmacy that prior authorization for Zepbound  15mg  is required/requested.   Insurance verification completed.   The patient is insured through [employer] .   Per test claim: PA required; PA submitted to above mentioned insurance via Phone Key/confirmation #/EOC Case ID [id number] Status is pending Chart notes submitted by fax to [facility] [phone]

## 2023-12-02 NOTE — Telephone Encounter (Signed)
 PA request has been Received. New Encounter has been or will be created for follow up. For additional info see Pharmacy Prior Auth telephone encounter from 12/02/23.

## 2023-12-02 NOTE — Telephone Encounter (Signed)
 PA request has been Submitted this morning. New Encounter has been or will be created for follow up. For additional info see Pharmacy Prior Auth telephone encounter from 12/02/23.

## 2023-12-02 NOTE — Telephone Encounter (Signed)
 Copied from CRM [id number]. Topic: Clinical - Prescription Issue >> Dec 02, 2023  9:16 AM Jacqueline Parsons wrote: Reason for CRM: Needs another authorization for Zepbound , her injection is due tomorrow.

## 2023-12-02 NOTE — Telephone Encounter (Signed)
 Copied from CRM [id number]. Topic: Clinical - Medication Prior Auth >> Dec 02, 2023  3:08 PM [name] D wrote: Reason for CRM: Prior auth for Zepbound  Pt states insurance faxed over paper work in regards to missing information, pt is asking if this can be taken care of and get sent back to insurance with missing information. Tomorrow is her next shot day and has no medication left.

## 2023-12-03 ENCOUNTER — Telehealth: Payer: Self-pay

## 2023-12-03 NOTE — Telephone Encounter (Signed)
 Additional information completed and faxed back this morning at 9:30am.

## 2023-12-03 NOTE — Telephone Encounter (Signed)
 Copied from CRM [id number]. Topic: Clinical - Medication Prior Auth >> Dec 02, 2023  3:08 PM [name] D wrote: Reason for CRM: Prior auth for Zepbound  Pt states insurance faxed over paper work in regards to missing information, pt is asking if this can be taken care of and get sent back to insurance with missing information. Tomorrow is her next shot day and has no medication left.

## 2023-12-03 NOTE — Telephone Encounter (Signed)
 Additional information has been requested from the patient's insurance in order to proceed with the prior authorization request. Requested information has been sent, or form has been filled out and faxed back to [phone]  Case ID: [id number] Phone# [phone]

## 2023-12-06 ENCOUNTER — Other Ambulatory Visit: Payer: Self-pay

## 2023-12-06 ENCOUNTER — Ambulatory Visit: Payer: Self-pay | Admitting: Nurse Practitioner

## 2023-12-06 ENCOUNTER — Other Ambulatory Visit (HOSPITAL_COMMUNITY): Payer: Self-pay

## 2023-12-06 DIAGNOSIS — E7800 Pure hypercholesterolemia, unspecified: Secondary | ICD-10-CM

## 2023-12-06 DIAGNOSIS — I10 Essential (primary) hypertension: Secondary | ICD-10-CM

## 2023-12-06 MED ORDER — LISINOPRIL 40 MG PO TABS: 40.0000 mg | ORAL_TABLET | Freq: Every day | ORAL | 3 refills | Status: AC

## 2023-12-06 MED ORDER — PRAVASTATIN SODIUM 40 MG PO TABS: 40.0000 mg | ORAL_TABLET | Freq: Every day | ORAL | 3 refills | Status: AC

## 2023-12-06 MED ORDER — LEVOTHYROXINE SODIUM 88 MCG PO TABS: 88.0000 ug | ORAL_TABLET | Freq: Every day | ORAL | 3 refills | Status: DC

## 2023-12-06 MED ORDER — VALACYCLOVIR HCL 500 MG PO TABS: 500.0000 mg | ORAL_TABLET | ORAL | 1 refills | Status: DC | PRN

## 2023-12-06 NOTE — Telephone Encounter (Signed)
 PA request has been Submitted. New Encounter has been or will be created for follow up. For additional info see Pharmacy Prior Auth telephone encounter from 12/02/23.

## 2023-12-08 ENCOUNTER — Encounter: Payer: Self-pay | Admitting: Nurse Practitioner

## 2023-12-08 ENCOUNTER — Other Ambulatory Visit (HOSPITAL_COMMUNITY): Payer: Self-pay

## 2023-12-08 NOTE — Telephone Encounter (Signed)
 Patient checking on prior auth status

## 2023-12-08 NOTE — Telephone Encounter (Signed)
 Copied from CRM [id number]. Topic: Clinical - Medication Prior Auth >> Dec 02, 2023  3:08 PM [name] D wrote: Reason for CRM: Prior auth for Zepbound  Pt states insurance faxed over paper work in regards to missing information, pt is asking if this can be taken care of and get sent back to insurance with missing information. Tomorrow is her next shot day and has no medication left.

## 2023-12-09 ENCOUNTER — Telehealth: Payer: Self-pay

## 2023-12-09 ENCOUNTER — Other Ambulatory Visit (HOSPITAL_COMMUNITY): Payer: Self-pay

## 2023-12-09 ENCOUNTER — Telehealth: Payer: Self-pay | Admitting: Nurse Practitioner

## 2023-12-09 NOTE — Telephone Encounter (Signed)
 Copied from CRM [id number]. Topic: Clinical - Medication Prior Auth >> Dec 02, 2023  3:08 PM [name] D wrote: Reason for CRM: Prior auth for Zepbound  Pt states insurance faxed over paper work in regards to missing information, pt is asking if this can be taken care of and get sent back to insurance with missing information. Tomorrow is her next shot day and has no medication left. >> Dec 08, 2023  3:46 PM [name] G wrote: Patient called.. checking status of prior auth?   >> Dec 03, 2023  3:46 PM [name] BRAVO wrote: Patient calling in asking about auth for read verbatim note:  Jacqueline Parsons DEL, CPhT to [facility]    12/03/23  9:40 AM Note Additional information completed and faxed back this morning at 9:30am.

## 2023-12-09 NOTE — Telephone Encounter (Signed)
 PA request has been Approved. New Encounter has been or will be created for follow up. For additional info see Pharmacy Prior Auth telephone encounter from 12/02/2023.

## 2023-12-09 NOTE — Telephone Encounter (Signed)
 Pharmacy Patient Advocate Encounter  Received notification from [facility] that Prior Authorization for Zepbound  15mg   has been APPROVED from 12/08/2023 to 12/07/2024. Unable to obtain price due to refill too soon rejection, last fill date 12/09/2023 next available fill date09/24/2025   PA #/Case ID/Reference #: [id number]

## 2023-12-09 NOTE — Telephone Encounter (Signed)
 PA was submitted per PA team

## 2023-12-09 NOTE — Telephone Encounter (Signed)
 Copied from CRM [id number]. Topic: Clinical - Medication Prior Auth >> Dec 02, 2023  3:08 PM [name] D wrote: Reason for CRM: Prior auth for Zepbound  Pt states insurance faxed over paper work in regards to missing information, pt is asking if this can be taken care of and get sent back to insurance with missing information. Tomorrow is her next shot day and has no medication left. >> Dec 08, 2023  3:46 PM [name] G wrote: Patient called.. checking status of prior auth?   >> Dec 03, 2023  3:46 PM [name] BRAVO wrote: Patient calling in asking about auth for read verbatim note:  Gladis Rock DEL, CPhT to [facility]    12/03/23  9:40 AM Note Additional information completed and faxed back this morning at 9:30am.

## 2024-01-20 ENCOUNTER — Other Ambulatory Visit: Payer: Self-pay | Admitting: Internal Medicine

## 2024-02-02 ENCOUNTER — Other Ambulatory Visit (HOSPITAL_COMMUNITY)
Admission: RE | Admit: 2024-02-02 | Discharge: 2024-02-02 | Disposition: A | Discharge status: Home / Self Care | Source: Ambulatory Visit | Attending: Adult Health | Admitting: Adult Health

## 2024-02-02 ENCOUNTER — Encounter: Payer: Self-pay | Admitting: Adult Health

## 2024-02-02 ENCOUNTER — Ambulatory Visit (INDEPENDENT_AMBULATORY_CARE_PROVIDER_SITE_OTHER): Admitting: Adult Health

## 2024-02-02 VITALS — BP 110/75 | HR 80 | Ht 64.0 in | Wt 266.5 lb

## 2024-02-02 DIAGNOSIS — K649 Unspecified hemorrhoids: Secondary | ICD-10-CM | POA: Diagnosis not present

## 2024-02-02 DIAGNOSIS — Z90710 Acquired absence of both cervix and uterus: Secondary | ICD-10-CM

## 2024-02-02 DIAGNOSIS — Z08 Encounter for follow-up examination after completed treatment for malignant neoplasm: Secondary | ICD-10-CM

## 2024-02-02 DIAGNOSIS — K5900 Constipation, unspecified: Secondary | ICD-10-CM

## 2024-02-02 DIAGNOSIS — Z8541 Personal history of malignant neoplasm of cervix uteri: Secondary | ICD-10-CM | POA: Diagnosis not present

## 2024-02-02 MED ORDER — HYDROCORTISONE ACETATE 25 MG RE SUPP: 25.0000 mg | Freq: Two times a day (BID) | RECTAL | 1 refills | Status: AC

## 2024-02-02 MED ORDER — SENNOSIDES-DOCUSATE SODIUM 8.6-50 MG PO TABS: ORAL_TABLET | ORAL | Status: AC

## 2024-02-02 NOTE — Progress Notes (Signed)
  Subjective:     Patient ID: Jacqueline Parsons, female   DOB: 1971-04-16, 53 y.o.   MRN: [id number]  HPI Jacqueline Parsons is a 53 year old black female, married, sp hysterectomy for cervical cancer, in complaining of hemorrhoids and pain. Has constipation.  She is on Zepbound  and has lost about 50 lbs.  She needs a pap, too.  PCP is [facility]  Review of Systems +hemorrhoids and pain Has seen some bleeding with hemorrhoid  +constipation Reviewed past medical,surgical, social and family history. Reviewed medications and allergies.     Objective:   Physical Exam BP 110/75 (BP Location: Left Arm, Patient Position: Sitting, Cuff Size: Large)   Pulse 80   Ht 5' 4 (1.626 m)   Wt 266 lb 8 oz (120.9 kg)   BMI 45.74 kg/m   Skin warm and dry.Pelvic: external genitalia is normal in appearance no lesions, vagina: pale,urethra has no lesions or masses noted, cervix and uterus are absent, pap with HR HPV genotyping performed, adnexa: no masses or tenderness noted. Bladder is non tender and no masses felt. On rectal exam there is no external hemorrhoid, has internal hemorrhoid, no fissure noted. Fall risk is low  Upstream - 02/02/24 1511       Pregnancy Intention Screening   Does the patient want to become pregnant in the next year? N/A    Does the patient's partner want to become pregnant in the next year? N/A    Would the patient like to discuss contraceptive options today? N/A      Contraception Wrap Up   Current Method Female Sterilization   hyst   End Method Female Sterilization   hyst   Contraception Counseling Provided No         Examination chaperoned by Clarita Salt LPN     Assessment:     1. Vaginal Pap smear following hysterectomy for malignancy Pap sent Pap in 1 year  - Cytology - PAP( [place])  2. History of cervical cancer Pap sent   3. S/P hysterectomy  4. Hemorrhoids, unspecified hemorrhoid type (Primary) Will rx anusol  HC supp, use 1 bid Use  epsom salt bath, can can use frozen peas  Meds ordered this encounter  Medications   senna-docusate (SENOKOT S) 8.6-50 MG tablet    Sig: Take 1-2 tablets at bedtime as needed    Supervising Provider:   JAYNE MINDER H [[id number]]   hydrocortisone  (ANUSOL -HC) 25 MG suppository    Sig: Place 1 suppository (25 mg total) rectally 2 (two) times daily.    Dispense:  12 suppository    Refill:  1    Supervising Provider:   JAYNE, LUTHER H [[id number]]     5. Constipation, unspecified constipation type Try senokot S 1-2 daily     Plan:     Follow up if not better, and let me know if continues to have pain will refer to GI for possible banding

## 2024-02-08 ENCOUNTER — Ambulatory Visit: Payer: Self-pay | Admitting: Adult Health

## 2024-02-08 LAB — CYTOLOGY - PAP
Comment: NEGATIVE
Diagnosis: NEGATIVE
High risk HPV: NEGATIVE

## 2024-02-09 ENCOUNTER — Encounter: Payer: Self-pay | Admitting: Gastroenterology

## 2024-02-09 ENCOUNTER — Other Ambulatory Visit: Payer: Self-pay | Admitting: Adult Health

## 2024-02-09 DIAGNOSIS — K649 Unspecified hemorrhoids: Secondary | ICD-10-CM

## 2024-02-09 DIAGNOSIS — K5900 Constipation, unspecified: Secondary | ICD-10-CM

## 2024-02-09 NOTE — Progress Notes (Signed)
 Refer to Therisa Stager NP for hemorrhoids and constipation

## 2024-02-21 ENCOUNTER — Other Ambulatory Visit: Payer: Self-pay | Admitting: Internal Medicine

## 2024-02-22 ENCOUNTER — Encounter: Payer: Self-pay | Admitting: Internal Medicine

## 2024-02-22 ENCOUNTER — Encounter (HOSPITAL_COMMUNITY): Payer: Self-pay

## 2024-02-22 ENCOUNTER — Other Ambulatory Visit (HOSPITAL_COMMUNITY): Payer: Self-pay

## 2024-02-25 ENCOUNTER — Other Ambulatory Visit: Payer: Self-pay | Admitting: Internal Medicine

## 2024-02-25 ENCOUNTER — Other Ambulatory Visit: Payer: Self-pay

## 2024-02-25 MED ORDER — ZEPBOUND 15 MG/0.5ML ~~LOC~~ SOAJ
15.0000 mg | SUBCUTANEOUS | 0 refills | Status: DC
  Filled 2024-02-25: qty 6, 84d supply, fill #0

## 2024-02-26 ENCOUNTER — Other Ambulatory Visit (HOSPITAL_COMMUNITY): Payer: Self-pay

## 2024-02-28 ENCOUNTER — Encounter (HOSPITAL_COMMUNITY): Payer: Self-pay

## 2024-02-28 ENCOUNTER — Other Ambulatory Visit: Payer: Self-pay

## 2024-02-28 ENCOUNTER — Other Ambulatory Visit (HOSPITAL_COMMUNITY): Payer: Self-pay

## 2024-03-01 ENCOUNTER — Ambulatory Visit (INDEPENDENT_AMBULATORY_CARE_PROVIDER_SITE_OTHER)

## 2024-03-01 VITALS — BP 112/74 | HR 88 | Ht 64.0 in | Wt 260.1 lb

## 2024-03-01 DIAGNOSIS — Z23 Encounter for immunization: Secondary | ICD-10-CM | POA: Diagnosis not present

## 2024-03-01 DIAGNOSIS — E611 Iron deficiency: Secondary | ICD-10-CM

## 2024-03-01 DIAGNOSIS — E039 Hypothyroidism, unspecified: Secondary | ICD-10-CM | POA: Diagnosis not present

## 2024-03-01 DIAGNOSIS — Z6841 Body Mass Index (BMI) 40.0 and over, adult: Secondary | ICD-10-CM | POA: Diagnosis not present

## 2024-03-01 DIAGNOSIS — I10 Essential (primary) hypertension: Secondary | ICD-10-CM

## 2024-03-01 DIAGNOSIS — E7800 Pure hypercholesterolemia, unspecified: Secondary | ICD-10-CM

## 2024-03-01 NOTE — Progress Notes (Signed)
 Established Patient Office Visit  Subjective   Patient ID: Jacqueline Parsons, female    DOB: 25-Jan-1971  Age: 53 y.o. MRN: [id number]  Chief Complaint  Patient presents with   Medical Management of Chronic Issues    Pt here for Zepbound  refill     HPI Discussed the use of AI scribe software for clinical note transcription with the patient, who gave verbal consent to proceed.  History of Present Illness   Jacqueline Parsons is a 53 year old female who presents for a follow-up on Zepbound  and management of constipation.  Weight management and zepbound  therapy - Currently on Zepbound  15 mg, the highest dose, with follow-up every three months - Gradual weight loss attributed to Zepbound   Constipation - Painful constipation, believed to be related to insufficient water intake - Manages symptoms with wipes, a gynecologist-prescribed spray, suppositories, stool softeners, and a rectal medication - Frequent use of stool softeners, especially with urge to defecate - Increases water intake to alleviate symptoms  Hemorrhoidal pain - History of hemorrhoids since childbirth - Describes hemorrhoidal pain as severe, rating it as 'ten' - Uses sitz baths and various treatments for symptom relief  Obstructive sleep apnea - History of severe obstructive sleep apnea, now improved to mild with weight loss - Recent sleep study confirmed improvement  Perimenopausal symptoms - History of hysterectomy in 2002 for cancer, with ovarian conservation - Experiences hot flashes and irritability - Hormone levels reported as normal by gynecologist  Foot injury sequelae - Sustained foot injury approximately ten years ago from dropping a dumbbell, resulting in swelling - Urgent care evaluation at the time showed no fracture, but possible old fracture suspected and deemed irreparable  Medication management - Takes levothyroxine  for thyroid  disorder, lisinopril  and hydrochlorothiazide for hypertension, and  iron supplements for low iron levels - Obtains prescriptions from [facility] and [facility] - Avoids [facility] due to confusion       Patient Active Problem List   Diagnosis Date Noted   Iron deficiency 03/08/2024   Hemorrhoids 02/02/2024   Constipation 09/03/2023   Persistent cough 11/20/2022   RLQ abdominal pain 09/08/2022   Perimenopause 09/22/2021   Sleep disturbance 09/22/2021   Moody 09/22/2021   Night sweats 09/22/2021   Hot flashes 09/22/2021   Encounter for annual routine gynecological examination 09/22/2021   Anxiety and depression 09/22/2021   S/P hysterectomy 09/22/2021   Encounter for screening fecal occult blood testing 09/18/2020   Encounter for well woman exam with routine gynecological exam 09/18/2020   Thyroid  disease 03/09/2019   History of cervical cancer 02/13/2019   Screening for colorectal cancer 02/13/2019   Vaginal Pap smear following hysterectomy for malignancy 02/13/2019   Dysphagia, idiopathic 01/04/2019   Swelling of right lower extremity 10/29/2017   Essential hypertension 10/29/2017   Hyperlipidemia 10/29/2017   Thyroid  nodule 10/13/2017   Adult hypothyroidism 10/13/2017   Chronic edema 11/24/2016   Metatarsalgia of right foot 11/24/2016   Stress reaction of bone 11/24/2016   BMI 45.0-49.9, adult (HCC) 2015    ROS    Objective:     BP 112/74   Pulse 88   Ht 5' 4 (1.626 m)   Wt 260 lb 1.9 oz (118 kg)   SpO2 97%   BMI 44.65 kg/m  BP Readings from Last 3 Encounters:  03/01/24 112/74  02/02/24 110/75  10/29/23 (!) 115/93   Wt Readings from Last 3 Encounters:  03/01/24 260 lb 1.9 oz (118 kg)  02/02/24 266 lb 8 oz (  120.9 kg)  10/29/23 272 lb 8 oz (123.6 kg)     Physical Exam Vitals and nursing note reviewed.  Constitutional:      Appearance: Normal appearance. She is obese.  HENT:     Head: Normocephalic.     Right Ear: Tympanic membrane, ear canal and external ear normal.     Left Ear: Tympanic membrane,  ear canal and external ear normal.     Nose: Nose normal.     Mouth/Throat:     Mouth: Mucous membranes are moist.     Pharynx: Oropharynx is clear.  Cardiovascular:     Rate and Rhythm: Normal rate and regular rhythm.  Pulmonary:     Effort: Pulmonary effort is normal.     Breath sounds: Normal breath sounds.  Musculoskeletal:     Cervical back: Normal range of motion and neck supple.  Skin:    General: Skin is warm and dry.  Neurological:     Mental Status: She is alert and oriented to person, place, and time.  Psychiatric:        Mood and Affect: Mood normal.        Thought Content: Thought content normal.      No results found for any visits on 03/01/24.    The 10-year ASCVD risk score (Arnett DK, et al., 2019) is: 1.7%    Assessment & Plan:   Problem List Items Addressed This Visit       Cardiovascular and Mediastinum   Essential hypertension - Primary   Remains adequately controlled on current antihypertensive regimen.  No medication changes are indicated today.  Refills provided.        Endocrine   Adult hypothyroidism   Currently prescribed levothyroxine  88 mcg daily.  Repeat thyroid  studies ordered today.        Other   BMI 45.0-49.9, adult (HCC) (Chronic)   Current weight 260 pounds.  BMI 44.65.  She has lost approximately 70 pounds since starting Zepbound  in July 2024.  Currently prescribed Zepbound  15 mg weekly.  She is pleased with the progress and is once again encouraged to maintain lifestyle modifications and weight loss.  Repeat labs ordered today.       Hyperlipidemia   Currently prescribed pravastatin  40 mg daily.  Repeat lipid panel ordered today.      Relevant Orders   CMP14+EGFR   Lipid Profile   TSH + free T4   Iron deficiency   Managed with iron supplements. Low iron levels have led to blood donation rejections. - Continue iron supplementation. - Order blood work to check iron levels.      Relevant Orders   CBC   Fe+TIBC+Fer    Other Visit Diagnoses       Immunization due       Relevant Orders   Flu vaccine trivalent PF, 6mos and older(Flulaval,Afluria,Fluarix,Fluzone) (Completed)        Return in about 3 months (around 06/01/2024) for chronic follow-up with PCP.    Leita Longs, FNP

## 2024-03-06 ENCOUNTER — Other Ambulatory Visit: Payer: Self-pay

## 2024-03-06 DIAGNOSIS — K5900 Constipation, unspecified: Secondary | ICD-10-CM

## 2024-03-08 DIAGNOSIS — E611 Iron deficiency: Secondary | ICD-10-CM | POA: Insufficient documentation

## 2024-03-08 NOTE — Assessment & Plan Note (Signed)
Remains adequately controlled on current antihypertensive regimen.  No medication changes are indicated today.  Refills provided.

## 2024-03-08 NOTE — Assessment & Plan Note (Signed)
 Current weight 260 pounds.  BMI 44.65.  She has lost approximately 70 pounds since starting Zepbound  in July 2024.  Currently prescribed Zepbound  15 mg weekly.  She is pleased with the progress and is once again encouraged to maintain lifestyle modifications and weight loss.  Repeat labs ordered today.

## 2024-03-08 NOTE — Assessment & Plan Note (Signed)
 Currently prescribed levothyroxine  88 mcg daily.  Repeat thyroid  studies ordered today.

## 2024-03-08 NOTE — Assessment & Plan Note (Signed)
Currently prescribed pravastatin 40 mg daily.  Repeat lipid panel ordered today.

## 2024-03-08 NOTE — Assessment & Plan Note (Signed)
 Managed with iron supplements. Low iron levels have led to blood donation rejections. - Continue iron supplementation. - Order blood work to check iron levels.

## 2024-03-15 ENCOUNTER — Encounter: Payer: Self-pay | Admitting: *Deleted

## 2024-03-15 ENCOUNTER — Telehealth: Payer: Self-pay | Admitting: *Deleted

## 2024-03-15 ENCOUNTER — Encounter: Payer: Self-pay | Admitting: Gastroenterology

## 2024-03-15 ENCOUNTER — Ambulatory Visit: Admitting: Gastroenterology

## 2024-03-15 ENCOUNTER — Other Ambulatory Visit: Payer: Self-pay | Admitting: *Deleted

## 2024-03-15 VITALS — BP 103/70 | HR 71 | Temp 98.6°F | Ht 64.0 in | Wt 266.8 lb

## 2024-03-15 DIAGNOSIS — R198 Other specified symptoms and signs involving the digestive system and abdomen: Secondary | ICD-10-CM

## 2024-03-15 DIAGNOSIS — Z860100 Personal history of colon polyps, unspecified: Secondary | ICD-10-CM | POA: Diagnosis not present

## 2024-03-15 DIAGNOSIS — K5900 Constipation, unspecified: Secondary | ICD-10-CM

## 2024-03-15 DIAGNOSIS — K625 Hemorrhage of anus and rectum: Secondary | ICD-10-CM

## 2024-03-15 DIAGNOSIS — K649 Unspecified hemorrhoids: Secondary | ICD-10-CM | POA: Insufficient documentation

## 2024-03-15 DIAGNOSIS — K642 Third degree hemorrhoids: Secondary | ICD-10-CM

## 2024-03-15 MED ORDER — PEG 3350-KCL-NA BICARB-NACL 420 G PO SOLR: 4000.0000 mL | Freq: Once | ORAL | 0 refills | Status: AC

## 2024-03-15 MED ORDER — LUBIPROSTONE 24 MCG PO CAPS: 24.0000 ug | ORAL_CAPSULE | Freq: Two times a day (BID) | ORAL | 5 refills | Status: DC

## 2024-03-15 NOTE — Telephone Encounter (Signed)
 LMOVM to return call  TCS w/Dr.Carver, asa 3, ok rm 1-2 Bisacodyl 10 mg daily x 2 days prior to starting prep

## 2024-03-15 NOTE — Patient Instructions (Signed)
 Start lubiprostone 24mcg twice daily with food for constipation. The first week you may have frequent loose stools. This typically gets better. You can cut back to once daily if needed.  Please avoid straining, prolonged sitting on toilet (try to limit to 3 minutes).   Please reach out if you find your bowels are not moving well.  Plan for colonoscopy in the near future. We will also let you know if you are a good candidate for internal hemorrhoid banding based on colonoscopy findings.

## 2024-03-15 NOTE — H&P (View-Only) (Signed)
 GI Office Note    Referring Provider: Signa Delon LABOR, NP Primary Care Physician:  Bevely Doffing, FNP  Primary Gastroenterologist: Carlin POUR. Cindie, DO   Chief Complaint   Chief Complaint  Patient presents with   New Patient (Initial Visit)    Pt referred for constipation and hemorrhoids    History of Present Illness   Jacqueline Parsons is a 53 y.o. female presenting today at the request of Delon Signa, NP for constipation, hemorrhoids.   Discussed the use of AI scribe software for clinical note transcription with the patient, who gave verbal consent to proceed.   She experiences severe constipation that has worsened since starting Zepbound . Used to have minor constipation, managed with diet. Now bowel movements are infrequent, sometimes occurring only once every two weeks unless laxatives are used. She uses Senna, taking two tablets at night, but finds it ineffective. Additionally, she takes Dulcolax (Bisacodyl), sometimes up to five to six 5 mg tablets in a day, to induce bowel movements. She typically has good results but then does not have another BM until she uses laxatives again. Her dietary habits include insufficient water intake, often sipping coffee throughout the day instead. She has started keeping a water bottle at work to remind herself to drink more. She has tried various remedies, including fiber supplements and prune juice, but reports minimal success.  Her hemorrhoids has been exacerbated by constipation. She describes a particularly severe episode where the hemorrhoid was painful and bleeding, stating it was 'the worst I've ever felt it.' She was evaluated by gyn, noted internal hemorrhoid, no fissure or external lesions. Started anusol  suppositories which were helpful. She uses medicated wipes and has found them helpful in calming the hemorrhoids. She reports seeing blood with hemorrhoid flare-ups. She notes hemorrhoid prolapses and she has to push back in  for relief.  No heartburn, dysphagia, N/V.   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 She had colonoscopy at [facility] at that time due to her BMI. She is not sure when her next colonoscopy was supposed to be.  Her BMI now is improved.    Wt Readings from Last 10 Encounters:  03/15/24 266 lb 12.8 oz (121 kg)  03/01/24 260 lb 1.9 oz (118 kg)  02/02/24 266 lb 8 oz (120.9 kg)  10/29/23 272 lb 8 oz (123.6 kg)  09/03/23 278 lb 12.8 oz (126.5 kg)  08/12/23 276 lb (125.2 kg)  02/22/23 (!) 305 lb 0.3 oz (138.4 kg)  12/04/22 (!) 317 lb 12.8 oz (144.2 kg)  11/20/22 (!) 321 lb 3.2 oz (145.7 kg)  11/20/22 (!) 321 lb (145.6 kg)    Prior Data    Colonoscopy 03/2019: -one ascending colon polyp removed.   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      No adenomatous dysplasia or malignancy identified.   Diagnosis Comment  The polyp shows a loose, bland spindle cell proliferation that disrupts the normal crypt spacing.  There is also some myxoid change.  While not the most classic appearing Schwann cell hamartoma, the spindle cell morphology and patchy staining with S100 and Sox-10 support the diagnosis.  Schwann cell hamartomas are benign lesions that should be cured with polypectomy.  Immunohistochemistry is performed on block A1.  The controls are adequate and representative tissue is present for evaluation.  S-100:  Highlights lesional  cells. Sox-10:  Highlights lesional cells. Dog-1:  Negative. CD34:  Negative in the lesional cells; highlights normal endothelial cells. EMA:  Negative in the lesional cells.  These tests were developed and their performance characteristics determined by [facility].  They have not been cleared or approved by the U.S. Food and Drug Administration.  The FDA has  determined that such clearance or approval is not necessary.  These tests are used for clinical purposes.  They should not be regarded as investigational or for research.  This laboratory is certified under the Clinical Laboratory Improvement Amendments of 1988 (CLIA) as qualified to perform high complexity clinical laboratory testing.    Medications   Current Outpatient Medications  Medication Sig Dispense Refill   albuterol  (VENTOLIN  HFA) 108 (90 Base) MCG/ACT inhaler Inhale 2 puffs into the lungs every 6 (six) hours as needed for wheezing or shortness of breath. 8 g 0   Ascorbic Acid (VITAMIN C PO) Take by mouth.     calcium gluconate 500 MG tablet Take 1 tablet by mouth daily.     cetirizine  (ZYRTEC ) 10 MG tablet Take 1 tablet (10 mg total) by mouth at bedtime. 90 tablet 3   Cholecalciferol (VITAMIN D3) 5000 units TABS Take by mouth daily.      diltiazem  (DILT-XR) 240 MG 24 hr capsule Take 1 capsule (240 mg total) by mouth daily. 90 capsule 3   diphenhydrAMINE HCl (BENADRYL PO) Take by mouth.     fluticasone  (FLONASE ) 50 MCG/ACT nasal spray Place 1 spray into both nostrils daily. 15.8 mL 3   hydrocortisone  (ANUSOL -HC) 25 MG suppository Place 1 suppository (25 mg total) rectally 2 (two) times daily. 12 suppository 1   levothyroxine  (SYNTHROID ) 88 MCG tablet TAKE 1 TABLET(88 MCG) BY MOUTH DAILY 30 tablet 3   lisinopril  (ZESTRIL ) 40 MG tablet Take 1 tablet (40 mg total) by mouth daily. 90 tablet 3   lubiprostone (AMITIZA) 24 MCG capsule Take 1 capsule (24 mcg total) by mouth 2 (two) times daily with a meal. 60 capsule 5   pediatric multivitamin-iron (POLY-VI-SOL WITH IRON) 15 MG chewable tablet Chew 1 tablet by mouth daily.     pravastatin  (PRAVACHOL ) 40 MG tablet Take 1 tablet (40 mg total) by mouth daily. 90 tablet 3   senna-docusate (SENOKOT S) 8.6-50 MG tablet Take 1-2 tablets at bedtime as needed     tirzepatide  (ZEPBOUND ) 15 MG/0.5ML Pen Inject 15 mg into the skin once a week. 6 mL 0    valACYclovir  (VALTREX ) 500 MG tablet TAKE 1 TABLET BY MOUTH DAILY AS NEEDED. 30 tablet 1   vitamin B-12 (CYANOCOBALAMIN) 500 MCG tablet Take by mouth.     No current facility-administered medications for this visit.    Allergies   Allergies as of 03/15/2024 - Review Complete 03/15/2024  Allergen Reaction Noted   Dust mite extract Cough, Hives, and Itching 12/04/2022     Past Medical History   Past Medical History:  Diagnosis Date   Allergy  1996   Anxiety    Cervical cancer (HCC) 2002   Depression    Gastric bypass status for obesity 2015   Hypertension    Oxygen deficiency    Sleep apnea 2007   Thyroid  disease    Vaginal Pap smear, abnormal     Past Surgical History   Past Surgical History:  Procedure Laterality Date   ABDOMINAL HYSTERECTOMY     arm reduction Bilateral 2021   CARPAL TUNNEL RELEASE  ROUX-EN-Y PROCEDURE  2015    Past Family History   Family History  Problem Relation Age of Onset   Alcohol abuse Mother    Alcohol abuse Father    Alcoholism Maternal Grandmother    Cancer Maternal Grandmother        female   Cancer Maternal Grandfather        lung   Hypertension Maternal Aunt    Thyroid  disease Neg Hx    Colon cancer Neg Hx    Colon polyps Neg Hx    Allergic rhinitis Neg Hx    Angioedema Neg Hx    Asthma Neg Hx    Eczema Neg Hx    Urticaria Neg Hx     Past Social History   Social History   Socioeconomic History   Marital status: Married    Spouse name: Not on file   Number of children: Not on file   Years of education: Not on file   Highest education level: 12th grade  Occupational History   Occupation: DSS  Tobacco Use   Smoking status: Never    Passive exposure: Never   Smokeless tobacco: Never  Vaping Use   Vaping status: Never Used  Substance and Sexual Activity   Alcohol use: Yes    Comment: occ   Drug use: No   Sexual activity: Not Currently    Birth control/protection: None    Comment: hyst  Other Topics  Concern   Not on file  Social History Narrative   SINGLE-ENGAGED. NO KIDS. WORKS AS A TAX COLLECTOR. SPENDS FREE TIME: CRAFTING.   Social Drivers of Corporate Investment Banker Strain: Low Risk  (02/28/2024)   Overall Financial Resource Strain (CARDIA)    Difficulty of Paying Living Expenses: Not very hard  Food Insecurity: No Food Insecurity (02/28/2024)   Hunger Vital Sign    Worried About Running Out of Food in the Last Year: Never true    Ran Out of Food in the Last Year: Never true  Transportation Needs: No Transportation Needs (02/28/2024)   PRAPARE - Administrator, Civil Service (Medical): No    Lack of Transportation (Non-Medical): No  Physical Activity: Inactive (02/28/2024)   Exercise Vital Sign    Days of Exercise per Week: 0 days    Minutes of Exercise per Session: Not on file  Stress: Stress Concern Present (02/28/2024)   [employer] of Occupational Health - Occupational Stress Questionnaire    Feeling of Stress: Rather much  Social Connections: Socially Integrated (02/28/2024)   Social Connection and Isolation Panel    Frequency of Communication with Friends and Family: More than three times a week    Frequency of Social Gatherings with Friends and Family: Once a week    Attends Religious Services: More than 4 times per year    Active Member of [employer] or Organizations: Yes    Attends Engineer, Structural: More than 4 times per year    Marital Status: Married  Catering Manager Violence: Not At Risk (09/22/2021)   Humiliation, Afraid, Rape, and Kick questionnaire    Fear of Current or Ex-Partner: No    Emotionally Abused: No    Physically Abused: No    Sexually Abused: No    Review of Systems   General: Negative for anorexia, weight loss, fever, chills, fatigue, weakness. ENT: Negative for hoarseness, difficulty swallowing , nasal congestion. CV: Negative for chest pain, angina, palpitations, dyspnea on exertion, peripheral edema.   Respiratory: Negative  for dyspnea at rest, dyspnea on exertion, cough, sputum, wheezing.  GI: See history of present illness. GU:  Negative for dysuria, hematuria, urinary incontinence, urinary frequency, nocturnal urination.  Endo: Negative for unusual weight change.     Physical Exam   BP 103/70   Pulse 71   Temp 98.6 F (37 C)   Ht 5' 4 (1.626 m)   Wt 266 lb 12.8 oz (121 kg)   BMI 45.80 kg/m    General: Well-nourished, well-developed in no acute distress.  Eyes: No icterus. Mouth: Oropharyngeal mucosa moist and pink   Lungs: Clear to auscultation bilaterally.  Heart: Regular rate and rhythm, no murmurs rubs or gallops.  Abdomen: Bowel sounds are normal, nontender, nondistended, no hepatosplenomegaly or masses,  no abdominal bruits or hernia , no rebound or guarding.  Rectal:   patient declined. Await colonoscopy Extremities: No lower extremity edema. No clubbing or deformities. Neuro: Alert and oriented x 4   Skin: Warm and dry, no jaundice.   Psych: Alert and cooperative, normal mood and affect.  Labs   Lab Results  Component Value Date   NA 140 03/15/2024   CL 100 03/15/2024   K 3.8 03/15/2024   CO2 27 03/15/2024   BUN 16 03/15/2024   CREATININE 1.21 (H) 03/15/2024   EGFR 55 (L) 03/15/2024   CALCIUM 9.5 03/15/2024   ALBUMIN 4.2 03/15/2024   GLUCOSE 79 03/15/2024   Lab Results  Component Value Date   ALT 16 03/15/2024   AST 23 03/15/2024   ALKPHOS 62 03/15/2024   BILITOT 0.5 03/15/2024   Lab Results  Component Value Date   WBC 4.6 03/15/2024   HGB 12.4 03/15/2024   HCT 38.1 03/15/2024   MCV 86 03/15/2024   PLT 251 03/15/2024   Lab Results  Component Value Date   TSH 1.650 03/15/2024   Lab Results  Component Value Date   HGBA1C 5.2 09/03/2023   Lab Results  Component Value Date   VITAMINB12 579 09/03/2023   Lab Results  Component Value Date   FOLATE 12.7 09/03/2023    Imaging Studies   No results found.  Assessment/Plan:       Chronic constipation Chronic constipation exacerbated by GLP-1 medication, leading to infrequent bowel movements, sometimes up to two weeks without a bowel movement. Current regimen includes Senna and Dulcolax, which are not providing adequate relief. She has tried increased water intake, fiber supplements, and prune juice without significant improvement. The constipation is contributing to hemorrhoid flare-ups.   - Discontinue Dulcolax.  - Start lubiprostone 24mcg BID with food.  - Monitor bowel movement frequency and consistency. Call if ineffective.   Internal hemorrhoids/rectal bleeding Internal hemorrhoids with prolapse, causing significant pain and occasional bleeding. Symptoms have been exacerbated by constipation and straining.   - Management of constipation. - Continue anusol  and/or medicated wipes as needed - Colonoscopy to be completed as outlined. - Evaluate at time of colonoscopy to determine candidacy for hemorrhoid banding.   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   -colonoscopy planned. ASA 3. Rm 1,2.       Sonny RAMAN. Ezzard, MHS, PA-C [facility]

## 2024-03-15 NOTE — Telephone Encounter (Signed)
 Pt has been scheduled for 03/27/24, instructions sent via mychart and prep sent to pharmacy.

## 2024-03-15 NOTE — Progress Notes (Signed)
 GI Office Note    Referring Provider: Signa Delon LABOR, NP Primary Care Physician:  Bevely Doffing, FNP  Primary Gastroenterologist: Carlin POUR. Cindie, DO   Chief Complaint   Chief Complaint  Patient presents with   New Patient (Initial Visit)    Pt referred for constipation and hemorrhoids    History of Present Illness   Jacqueline Parsons is a 53 y.o. female presenting today at the request of Delon Signa, NP for constipation, hemorrhoids.   Discussed the use of AI scribe software for clinical note transcription with the patient, who gave verbal consent to proceed.   She experiences severe constipation that has worsened since starting Zepbound . Used to have minor constipation, managed with diet. Now bowel movements are infrequent, sometimes occurring only once every two weeks unless laxatives are used. She uses Senna, taking two tablets at night, but finds it ineffective. Additionally, she takes Dulcolax (Bisacodyl), sometimes up to five to six 5 mg tablets in a day, to induce bowel movements. She typically has good results but then does not have another BM until she uses laxatives again. Her dietary habits include insufficient water intake, often sipping coffee throughout the day instead. She has started keeping a water bottle at work to remind herself to drink more. She has tried various remedies, including fiber supplements and prune juice, but reports minimal success.  Her hemorrhoids has been exacerbated by constipation. She describes a particularly severe episode where the hemorrhoid was painful and bleeding, stating it was 'the worst I've ever felt it.' She was evaluated by gyn, noted internal hemorrhoid, no fissure or external lesions. Started anusol  suppositories which were helpful. She uses medicated wipes and has found them helpful in calming the hemorrhoids. She reports seeing blood with hemorrhoid flare-ups. She notes hemorrhoid prolapses and she has to push back in  for relief.  No heartburn, dysphagia, N/V.   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 She had colonoscopy at [facility] at that time due to her BMI. She is not sure when her next colonoscopy was supposed to be.  Her BMI now is improved.    Wt Readings from Last 10 Encounters:  03/15/24 266 lb 12.8 oz (121 kg)  03/01/24 260 lb 1.9 oz (118 kg)  02/02/24 266 lb 8 oz (120.9 kg)  10/29/23 272 lb 8 oz (123.6 kg)  09/03/23 278 lb 12.8 oz (126.5 kg)  08/12/23 276 lb (125.2 kg)  02/22/23 (!) 305 lb 0.3 oz (138.4 kg)  12/04/22 (!) 317 lb 12.8 oz (144.2 kg)  11/20/22 (!) 321 lb 3.2 oz (145.7 kg)  11/20/22 (!) 321 lb (145.6 kg)    Prior Data    Colonoscopy 03/2019: -one ascending colon polyp removed.   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      No adenomatous dysplasia or malignancy identified.   Diagnosis Comment  The polyp shows a loose, bland spindle cell proliferation that disrupts the normal crypt spacing.  There is also some myxoid change.  While not the most classic appearing Schwann cell hamartoma, the spindle cell morphology and patchy staining with S100 and Sox-10 support the diagnosis.  Schwann cell hamartomas are benign lesions that should be cured with polypectomy.  Immunohistochemistry is performed on block A1.  The controls are adequate and representative tissue is present for evaluation.  S-100:  Highlights lesional  cells. Sox-10:  Highlights lesional cells. Dog-1:  Negative. CD34:  Negative in the lesional cells; highlights normal endothelial cells. EMA:  Negative in the lesional cells.  These tests were developed and their performance characteristics determined by [facility].  They have not been cleared or approved by the U.S. Food and Drug Administration.  The FDA has  determined that such clearance or approval is not necessary.  These tests are used for clinical purposes.  They should not be regarded as investigational or for research.  This laboratory is certified under the Clinical Laboratory Improvement Amendments of 1988 (CLIA) as qualified to perform high complexity clinical laboratory testing.    Medications   Current Outpatient Medications  Medication Sig Dispense Refill   albuterol  (VENTOLIN  HFA) 108 (90 Base) MCG/ACT inhaler Inhale 2 puffs into the lungs every 6 (six) hours as needed for wheezing or shortness of breath. 8 g 0   Ascorbic Acid (VITAMIN C PO) Take by mouth.     calcium gluconate 500 MG tablet Take 1 tablet by mouth daily.     cetirizine  (ZYRTEC ) 10 MG tablet Take 1 tablet (10 mg total) by mouth at bedtime. 90 tablet 3   Cholecalciferol (VITAMIN D3) 5000 units TABS Take by mouth daily.      diltiazem  (DILT-XR) 240 MG 24 hr capsule Take 1 capsule (240 mg total) by mouth daily. 90 capsule 3   diphenhydrAMINE HCl (BENADRYL PO) Take by mouth.     fluticasone  (FLONASE ) 50 MCG/ACT nasal spray Place 1 spray into both nostrils daily. 15.8 mL 3   hydrocortisone  (ANUSOL -HC) 25 MG suppository Place 1 suppository (25 mg total) rectally 2 (two) times daily. 12 suppository 1   levothyroxine  (SYNTHROID ) 88 MCG tablet TAKE 1 TABLET(88 MCG) BY MOUTH DAILY 30 tablet 3   lisinopril  (ZESTRIL ) 40 MG tablet Take 1 tablet (40 mg total) by mouth daily. 90 tablet 3   lubiprostone (AMITIZA) 24 MCG capsule Take 1 capsule (24 mcg total) by mouth 2 (two) times daily with a meal. 60 capsule 5   pediatric multivitamin-iron (POLY-VI-SOL WITH IRON) 15 MG chewable tablet Chew 1 tablet by mouth daily.     pravastatin  (PRAVACHOL ) 40 MG tablet Take 1 tablet (40 mg total) by mouth daily. 90 tablet 3   senna-docusate (SENOKOT S) 8.6-50 MG tablet Take 1-2 tablets at bedtime as needed     tirzepatide  (ZEPBOUND ) 15 MG/0.5ML Pen Inject 15 mg into the skin once a week. 6 mL 0    valACYclovir  (VALTREX ) 500 MG tablet TAKE 1 TABLET BY MOUTH DAILY AS NEEDED. 30 tablet 1   vitamin B-12 (CYANOCOBALAMIN) 500 MCG tablet Take by mouth.     No current facility-administered medications for this visit.    Allergies   Allergies as of 03/15/2024 - Review Complete 03/15/2024  Allergen Reaction Noted   Dust mite extract Cough, Hives, and Itching 12/04/2022     Past Medical History   Past Medical History:  Diagnosis Date   Allergy  1996   Anxiety    Cervical cancer (HCC) 2002   Depression    Gastric bypass status for obesity 2015   Hypertension    Oxygen deficiency    Sleep apnea 2007   Thyroid  disease    Vaginal Pap smear, abnormal     Past Surgical History   Past Surgical History:  Procedure Laterality Date   ABDOMINAL HYSTERECTOMY     arm reduction Bilateral 2021   CARPAL TUNNEL RELEASE  ROUX-EN-Y PROCEDURE  2015    Past Family History   Family History  Problem Relation Age of Onset   Alcohol abuse Mother    Alcohol abuse Father    Alcoholism Maternal Grandmother    Cancer Maternal Grandmother        female   Cancer Maternal Grandfather        lung   Hypertension Maternal Aunt    Thyroid  disease Neg Hx    Colon cancer Neg Hx    Colon polyps Neg Hx    Allergic rhinitis Neg Hx    Angioedema Neg Hx    Asthma Neg Hx    Eczema Neg Hx    Urticaria Neg Hx     Past Social History   Social History   Socioeconomic History   Marital status: Married    Spouse name: Not on file   Number of children: Not on file   Years of education: Not on file   Highest education level: 12th grade  Occupational History   Occupation: DSS  Tobacco Use   Smoking status: Never    Passive exposure: Never   Smokeless tobacco: Never  Vaping Use   Vaping status: Never Used  Substance and Sexual Activity   Alcohol use: Yes    Comment: occ   Drug use: No   Sexual activity: Not Currently    Birth control/protection: None    Comment: hyst  Other Topics  Concern   Not on file  Social History Narrative   SINGLE-ENGAGED. NO KIDS. WORKS AS A TAX COLLECTOR. SPENDS FREE TIME: CRAFTING.   Social Drivers of Corporate Investment Banker Strain: Low Risk  (02/28/2024)   Overall Financial Resource Strain (CARDIA)    Difficulty of Paying Living Expenses: Not very hard  Food Insecurity: No Food Insecurity (02/28/2024)   Hunger Vital Sign    Worried About Running Out of Food in the Last Year: Never true    Ran Out of Food in the Last Year: Never true  Transportation Needs: No Transportation Needs (02/28/2024)   PRAPARE - Administrator, Civil Service (Medical): No    Lack of Transportation (Non-Medical): No  Physical Activity: Inactive (02/28/2024)   Exercise Vital Sign    Days of Exercise per Week: 0 days    Minutes of Exercise per Session: Not on file  Stress: Stress Concern Present (02/28/2024)   [employer] of Occupational Health - Occupational Stress Questionnaire    Feeling of Stress: Rather much  Social Connections: Socially Integrated (02/28/2024)   Social Connection and Isolation Panel    Frequency of Communication with Friends and Family: More than three times a week    Frequency of Social Gatherings with Friends and Family: Once a week    Attends Religious Services: More than 4 times per year    Active Member of [employer] or Organizations: Yes    Attends Engineer, Structural: More than 4 times per year    Marital Status: Married  Catering Manager Violence: Not At Risk (09/22/2021)   Humiliation, Afraid, Rape, and Kick questionnaire    Fear of Current or Ex-Partner: No    Emotionally Abused: No    Physically Abused: No    Sexually Abused: No    Review of Systems   General: Negative for anorexia, weight loss, fever, chills, fatigue, weakness. ENT: Negative for hoarseness, difficulty swallowing , nasal congestion. CV: Negative for chest pain, angina, palpitations, dyspnea on exertion, peripheral edema.   Respiratory: Negative  for dyspnea at rest, dyspnea on exertion, cough, sputum, wheezing.  GI: See history of present illness. GU:  Negative for dysuria, hematuria, urinary incontinence, urinary frequency, nocturnal urination.  Endo: Negative for unusual weight change.     Physical Exam   BP 103/70   Pulse 71   Temp 98.6 F (37 C)   Ht 5' 4 (1.626 m)   Wt 266 lb 12.8 oz (121 kg)   BMI 45.80 kg/m    General: Well-nourished, well-developed in no acute distress.  Eyes: No icterus. Mouth: Oropharyngeal mucosa moist and pink   Lungs: Clear to auscultation bilaterally.  Heart: Regular rate and rhythm, no murmurs rubs or gallops.  Abdomen: Bowel sounds are normal, nontender, nondistended, no hepatosplenomegaly or masses,  no abdominal bruits or hernia , no rebound or guarding.  Rectal:   patient declined. Await colonoscopy Extremities: No lower extremity edema. No clubbing or deformities. Neuro: Alert and oriented x 4   Skin: Warm and dry, no jaundice.   Psych: Alert and cooperative, normal mood and affect.  Labs   Lab Results  Component Value Date   NA 140 03/15/2024   CL 100 03/15/2024   K 3.8 03/15/2024   CO2 27 03/15/2024   BUN 16 03/15/2024   CREATININE 1.21 (H) 03/15/2024   EGFR 55 (L) 03/15/2024   CALCIUM 9.5 03/15/2024   ALBUMIN 4.2 03/15/2024   GLUCOSE 79 03/15/2024   Lab Results  Component Value Date   ALT 16 03/15/2024   AST 23 03/15/2024   ALKPHOS 62 03/15/2024   BILITOT 0.5 03/15/2024   Lab Results  Component Value Date   WBC 4.6 03/15/2024   HGB 12.4 03/15/2024   HCT 38.1 03/15/2024   MCV 86 03/15/2024   PLT 251 03/15/2024   Lab Results  Component Value Date   TSH 1.650 03/15/2024   Lab Results  Component Value Date   HGBA1C 5.2 09/03/2023   Lab Results  Component Value Date   VITAMINB12 579 09/03/2023   Lab Results  Component Value Date   FOLATE 12.7 09/03/2023    Imaging Studies   No results found.  Assessment/Plan:       Chronic constipation Chronic constipation exacerbated by GLP-1 medication, leading to infrequent bowel movements, sometimes up to two weeks without a bowel movement. Current regimen includes Senna and Dulcolax, which are not providing adequate relief. She has tried increased water intake, fiber supplements, and prune juice without significant improvement. The constipation is contributing to hemorrhoid flare-ups.   - Discontinue Dulcolax.  - Start lubiprostone 24mcg BID with food.  - Monitor bowel movement frequency and consistency. Call if ineffective.   Internal hemorrhoids/rectal bleeding Internal hemorrhoids with prolapse, causing significant pain and occasional bleeding. Symptoms have been exacerbated by constipation and straining.   - Management of constipation. - Continue anusol  and/or medicated wipes as needed - Colonoscopy to be completed as outlined. - Evaluate at time of colonoscopy to determine candidacy for hemorrhoid banding.   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   -colonoscopy planned. ASA 3. Rm 1,2.       Sonny RAMAN. Ezzard, MHS, PA-C [facility]

## 2024-03-19 LAB — LIPID PANEL
Chol/HDL Ratio: 2.8 ratio (ref 0.0–4.4)
Cholesterol, Total: 158 mg/dL (ref 100–199)
HDL: 57 mg/dL (ref >39)
LDL Chol Calc (NIH): 90 mg/dL (ref 0–99)
Triglycerides: 56 mg/dL (ref 0–149)
VLDL Cholesterol Cal: 11 mg/dL (ref 5–40)

## 2024-03-19 LAB — IRON,TIBC AND FERRITIN PANEL
Ferritin: 240 ng/mL — ABNORMAL HIGH (ref 15–150)
Iron Saturation: 32 % (ref 15–55)
Iron: 90 ug/dL (ref 27–159)
Total Iron Binding Capacity: 281 ug/dL (ref 250–450)
UIBC: 191 ug/dL (ref 131–425)

## 2024-03-19 LAB — CBC
Hematocrit: 38.1 % (ref 34.0–46.6)
Hemoglobin: 12.4 g/dL (ref 11.1–15.9)
MCH: 28.1 pg (ref 26.6–33.0)
MCHC: 32.5 g/dL (ref 31.5–35.7)
MCV: 86 fL (ref 79–97)
Platelets: 251 x10E3/uL (ref 150–450)
RBC: 4.41 x10E6/uL (ref 3.77–5.28)
RDW: 13.2 % (ref 11.7–15.4)
WBC: 4.6 x10E3/uL (ref 3.4–10.8)

## 2024-03-19 LAB — CMP14+EGFR
ALT: 16 IU/L (ref 0–32)
AST: 23 IU/L (ref 0–40)
Albumin: 4.2 g/dL (ref 3.9–4.9)
Alkaline Phosphatase: 62 IU/L (ref 41–116)
BUN/Creatinine Ratio: 13 (ref 9–23)
BUN: 16 mg/dL (ref 6–24)
Bilirubin Total: 0.5 mg/dL (ref 0.0–1.2)
CO2: 27 mmol/L (ref 20–29)
Calcium: 9.5 mg/dL (ref 8.7–10.2)
Chloride: 100 mmol/L (ref 96–106)
Creatinine, Ser: 1.21 mg/dL — ABNORMAL HIGH (ref 0.57–1.00)
Globulin, Total: 2.8 g/dL (ref 1.5–4.5)
Glucose: 79 mg/dL (ref 70–99)
Potassium: 3.8 mmol/L (ref 3.5–5.2)
Sodium: 140 mmol/L (ref 134–144)
Total Protein: 7 g/dL (ref 6.0–8.5)
eGFR: 55 mL/min/1.73 — ABNORMAL LOW (ref >59)

## 2024-03-19 LAB — GLIA (IGA/G) + TTG IGA
Antigliadin Abs, IgA: 4 U (ref 0–19)
Gliadin IgG: 2 U (ref 0–19)
Transglutaminase IgA: <2 U/mL (ref 0–3)

## 2024-03-19 LAB — TSH+FREE T4
Free T4: 1.4 ng/dL (ref 0.82–1.77)
TSH: 1.65 u[IU]/mL (ref 0.450–4.500)

## 2024-03-21 NOTE — Telephone Encounter (Signed)
 [facility] PA: Case ID# [id number] was submitted on 03-21-2024

## 2024-03-23 ENCOUNTER — Encounter (HOSPITAL_COMMUNITY): Admission: RE | Admit: 2024-03-23 | Source: Ambulatory Visit

## 2024-03-23 ENCOUNTER — Ambulatory Visit: Payer: Self-pay

## 2024-03-30 ENCOUNTER — Telehealth: Payer: Self-pay | Admitting: Gastroenterology

## 2024-03-30 NOTE — Telephone Encounter (Addendum)
 Opened in error

## 2024-04-02 ENCOUNTER — Other Ambulatory Visit: Payer: Self-pay

## 2024-04-06 NOTE — Telephone Encounter (Signed)
 [facility] PA: Approved, Case ID:  [id number]  From Date To Date Units Status Reason 03/27/2024 06/27/2024 1 Approve

## 2024-04-07 ENCOUNTER — Encounter (HOSPITAL_COMMUNITY)
Admission: RE | Admit: 2024-04-07 | Discharge: 2024-04-07 | Disposition: A | Discharge status: Home / Self Care | Source: Ambulatory Visit | Attending: Internal Medicine | Admitting: Internal Medicine

## 2024-04-07 ENCOUNTER — Encounter (HOSPITAL_COMMUNITY): Payer: Self-pay

## 2024-04-07 ENCOUNTER — Other Ambulatory Visit: Payer: Self-pay

## 2024-04-10 ENCOUNTER — Ambulatory Visit (HOSPITAL_COMMUNITY): Admitting: Certified Registered"

## 2024-04-10 ENCOUNTER — Encounter (HOSPITAL_COMMUNITY)
Admission: RE | Disposition: A | Discharge status: Home / Self Care | Payer: Self-pay | Source: Home / Self Care | Attending: Internal Medicine

## 2024-04-10 ENCOUNTER — Encounter (HOSPITAL_COMMUNITY): Payer: Self-pay | Admitting: Internal Medicine

## 2024-04-10 ENCOUNTER — Ambulatory Visit (HOSPITAL_COMMUNITY)
Admission: RE | Admit: 2024-04-10 | Discharge: 2024-04-10 | Disposition: A | Discharge status: Home / Self Care | Attending: Internal Medicine | Admitting: Internal Medicine

## 2024-04-10 DIAGNOSIS — G4730 Sleep apnea, unspecified: Secondary | ICD-10-CM | POA: Insufficient documentation

## 2024-04-10 DIAGNOSIS — D123 Benign neoplasm of transverse colon: Secondary | ICD-10-CM

## 2024-04-10 DIAGNOSIS — K5140 Inflammatory polyps of colon without complications: Secondary | ICD-10-CM | POA: Diagnosis not present

## 2024-04-10 DIAGNOSIS — K648 Other hemorrhoids: Secondary | ICD-10-CM | POA: Diagnosis not present

## 2024-04-10 DIAGNOSIS — Z1211 Encounter for screening for malignant neoplasm of colon: Secondary | ICD-10-CM

## 2024-04-10 DIAGNOSIS — K5909 Other constipation: Secondary | ICD-10-CM | POA: Insufficient documentation

## 2024-04-10 DIAGNOSIS — Z860100 Personal history of colon polyps, unspecified: Secondary | ICD-10-CM

## 2024-04-10 DIAGNOSIS — F418 Other specified anxiety disorders: Secondary | ICD-10-CM | POA: Diagnosis not present

## 2024-04-10 DIAGNOSIS — K635 Polyp of colon: Secondary | ICD-10-CM | POA: Diagnosis not present

## 2024-04-10 DIAGNOSIS — E6689 Other obesity not elsewhere classified: Secondary | ICD-10-CM | POA: Diagnosis not present

## 2024-04-10 DIAGNOSIS — Z6841 Body Mass Index (BMI) 40.0 and over, adult: Secondary | ICD-10-CM | POA: Diagnosis not present

## 2024-04-10 DIAGNOSIS — I10 Essential (primary) hypertension: Secondary | ICD-10-CM | POA: Insufficient documentation

## 2024-04-10 DIAGNOSIS — T50995A Adverse effect of other drugs, medicaments and biological substances, initial encounter: Secondary | ICD-10-CM | POA: Diagnosis not present

## 2024-04-10 DIAGNOSIS — E039 Hypothyroidism, unspecified: Secondary | ICD-10-CM | POA: Insufficient documentation

## 2024-04-10 HISTORY — PX: COLONOSCOPY: SHX5424

## 2024-04-10 SURGERY — COLONOSCOPY
Anesthesia: Monitor Anesthesia Care

## 2024-04-10 MED ORDER — LIDOCAINE HCL (CARDIAC) PF 100 MG/5ML IV SOSY
PREFILLED_SYRINGE | INTRAVENOUS | Status: DC | PRN
  Administered 2024-04-10: 100 mg via INTRAVENOUS

## 2024-04-10 MED ORDER — PROPOFOL 10 MG/ML IV BOLUS
INTRAVENOUS | Status: DC | PRN
  Administered 2024-04-10: 100 mg via INTRAVENOUS
  Administered 2024-04-10: 150 ug/kg/min via INTRAVENOUS

## 2024-04-10 MED ORDER — LACTATED RINGERS IV SOLN: INTRAVENOUS | Status: DC

## 2024-04-10 MED ORDER — PHENYLEPHRINE HCL (PRESSORS) 10 MG/ML IV SOLN
INTRAVENOUS | Status: DC | PRN
  Administered 2024-04-10: 100 ug via INTRAVENOUS

## 2024-04-10 NOTE — Op Note (Signed)
 [facility] Patient Name: Jacqueline Parsons Procedure Date: 04/10/2024 9:13 AM MRN: [id number] Date of Birth: 26-Jan-1971 Attending MD: Carlin POUR. Cindie , [place], [id number] CSN: [id number] Age: 53 Admit Type: Outpatient Procedure:                Colonoscopy Indications:              High risk colon cancer surveillance: Personal                            history of colonic polyps Providers:                Carlin POUR. Cindie, DO, Harlene Lips, Dorcas Lenis, Technician Referring MD:              Medicines:                See the Anesthesia note for documentation of the                            administered medications Complications:            No immediate complications. Estimated Blood Loss:     Estimated blood loss was minimal. Procedure:                Pre-Anesthesia Assessment:                           - The anesthesia plan was to use monitored                            anesthesia care (MAC).                           After obtaining informed consent, the colonoscope                            was passed under direct vision. Throughout the                            procedure, the patient's blood pressure, pulse, and                            oxygen saturations were monitored continuously. The                            [id number]-HQ190L ([id number]) Peds Colon was introduced                            through the anus and advanced to the the cecum,                            identified by appendiceal orifice and ileocecal                            valve. The colonoscopy was performed without  difficulty. The patient tolerated the procedure                            well. The quality of the bowel preparation was                            evaluated using the BBPS [facility] Bowel Preparation                            Scale) with scores of: Right Colon = 3, Transverse                            Colon = 3 and Left Colon = 3  (entire mucosa seen                            well with no residual staining, small fragments of                            stool or opaque liquid). The total BBPS score                            equals 9. Scope In: 9:25:13 AM Scope Out: 9:45:03 AM Scope Withdrawal Time: 0 hours 13 minutes 23 seconds  Total Procedure Duration: 0 hours 19 minutes 50 seconds  Findings:      Non-bleeding internal hemorrhoids were found.      A 5 mm polyp was found in the transverse colon. The polyp was       pedunculated, ?inflammatory. The polyp was removed with a cold snare.       Resection and retrieval were complete.      The exam was otherwise without abnormality. Impression:               - Non-bleeding internal hemorrhoids.                           - One 5 mm polyp in the transverse colon, removed                            with a cold snare. Resected and retrieved.                           - The examination was otherwise normal. Moderate Sedation:      Per Anesthesia Care Recommendation:           - Patient has a contact number available for                            emergencies. The signs and symptoms of potential                            delayed complications were discussed with the                            patient. Return to normal activities tomorrow.  Written discharge instructions were provided to the                            patient.                           - Resume previous diet.                           - Continue present medications.                           - Await pathology results.                           - Repeat colonoscopy in 7 years for surveillance.                           - Return to GI clinic in 8 weeks. Consider                            hemorrhoid banding. Procedure Code(s):        --- Professional ---                           [phone], Colonoscopy, flexible; with removal of                            tumor(s), polyp(s), or other  lesion(s) by snare                            technique Diagnosis Code(s):        --- Professional ---                           Z86.010, Personal history of colonic polyps                           D12.3, Benign neoplasm of transverse colon (hepatic                            flexure or splenic flexure)                           K64.8, Other hemorrhoids CPT copyright 2022 American Medical Association. All rights reserved. The codes documented in this report are preliminary and upon coder review may  be revised to meet current compliance requirements. Carlin POUR. Cindie, DO Carlin POUR. Cindie, DO 04/10/2024 9:47:51 AM This report has been signed electronically. Number of Addenda: 0

## 2024-04-10 NOTE — Anesthesia Postprocedure Evaluation (Signed)
 Anesthesia Post Note  Patient: Jacqueline Parsons  Procedure(s) Performed: COLONOSCOPY  Patient location during evaluation: Phase II Anesthesia Type: MAC Level of consciousness: awake Pain management: pain level controlled Vital Signs Assessment: post-procedure vital signs reviewed and stable Respiratory status: spontaneous breathing and respiratory function stable Cardiovascular status: blood pressure returned to baseline and stable Postop Assessment: no headache and no apparent nausea or vomiting Anesthetic complications: no Comments: Late entry   No notable events documented.   Last Vitals:  Vitals:   04/10/24 0838 04/10/24 0950  BP: 124/61 114/80  Pulse:  84  Resp: 15 13  Temp: 37 C 36.6 C  SpO2: 100% 100%    Last Pain:  Vitals:   04/10/24 0950  TempSrc: Oral  PainSc: 0-No pain                 Yvonna JINNY Bosworth

## 2024-04-10 NOTE — Interval H&P Note (Signed)
 History and Physical Interval Note:  04/10/2024 8:35 AM  Levy Louder  has presented today for surgery, with the diagnosis of hx: colon polyp,change in bowel habits,RB.  The various methods of treatment have been discussed with the patient and family. After consideration of risks, benefits and other options for treatment, the patient has consented to  Procedure(s) with comments: COLONOSCOPY (N/A) - 9:15 am, asa 3 as a surgical intervention.  The patient's history has been reviewed, patient examined, no change in status, stable for surgery.  I have reviewed the patient's chart and labs.  Questions were answered to the patient's satisfaction.     Carlin MARLA Hasty

## 2024-04-10 NOTE — Anesthesia Preprocedure Evaluation (Signed)
 Anesthesia Evaluation  Patient identified by MRN, date of birth, ID band Patient awake    Reviewed: Allergy  & Precautions, H&P , NPO status , Patient's Chart, lab work & pertinent test results, reviewed documented beta blocker date and time   Airway Mallampati: II  TM Distance: >3 FB Neck ROM: full    Dental no notable dental hx.    Pulmonary neg pulmonary ROS, sleep apnea    Pulmonary exam normal breath sounds clear to auscultation       Cardiovascular Exercise Tolerance: Good hypertension, negative cardio ROS  Rhythm:regular Rate:Normal     Neuro/Psych  PSYCHIATRIC DISORDERS Anxiety Depression    negative neurological ROS  negative psych ROS   GI/Hepatic negative GI ROS, Neg liver ROS,,,  Endo/Other  negative endocrine ROSHypothyroidism  Class 4 obesity  Renal/GU negative Renal ROS  negative genitourinary   Musculoskeletal   Abdominal   Peds  Hematology negative hematology ROS (+)   Anesthesia Other Findings   Reproductive/Obstetrics negative OB ROS                              Anesthesia Physical Anesthesia Plan  ASA: 3  Anesthesia Plan: MAC   Post-op Pain Management:    Induction:   PONV Risk Score and Plan: Propofol  infusion  Airway Management Planned:   Additional Equipment:   Intra-op Plan:   Post-operative Plan:   Informed Consent: I have reviewed the patients History and Physical, chart, labs and discussed the procedure including the risks, benefits and alternatives for the proposed anesthesia with the patient or authorized representative who has indicated his/her understanding and acceptance.     Dental Advisory Given  Plan Discussed with: CRNA  Anesthesia Plan Comments:         Anesthesia Quick Evaluation

## 2024-04-10 NOTE — Transfer of Care (Signed)
 Immediate Anesthesia Transfer of Care Note  Patient: Jacqueline Parsons  Procedure(s) Performed: COLONOSCOPY  Patient Location: Short Stay  Anesthesia Type:General  Level of Consciousness: awake, alert , oriented, and patient cooperative  Airway & Oxygen Therapy: Patient Spontanous Breathing  Post-op Assessment: Report given to RN and Post -op Vital signs reviewed and stable  Post vital signs: Reviewed and stable  Last Vitals:  Vitals Value Taken Time  BP 114/74   Temp 98.2   Pulse 66   Resp 18   SpO2 96     Last Pain:  Vitals:   04/10/24 0921  PainSc: 0-No pain         Complications: No notable events documented.

## 2024-04-10 NOTE — Discharge Instructions (Addendum)
  Colonoscopy Discharge Instructions  Read the instructions outlined below and refer to this sheet in the next few weeks. These discharge instructions provide you with general information on caring for yourself after you leave the hospital. Your doctor may also give you specific instructions. While your treatment has been planned according to the most current medical practices available, unavoidable complications occasionally occur.   ACTIVITY You may resume your regular activity, but move at a slower pace for the next 24 hours.  Take frequent rest periods for the next 24 hours.  Walking will help get rid of the air and reduce the bloated feeling in your belly (abdomen).  No driving for 24 hours (because of the medicine (anesthesia) used during the test).   Do not sign any important legal documents or operate any machinery for 24 hours (because of the anesthesia used during the test).  NUTRITION Drink plenty of fluids.  You may resume your normal diet as instructed by your doctor.  Begin with a light meal and progress to your normal diet. Heavy or fried foods are harder to digest and may make you feel sick to your stomach (nauseated).  Avoid alcoholic beverages for 24 hours or as instructed.  MEDICATIONS You may resume your normal medications unless your doctor tells you otherwise.  WHAT YOU CAN EXPECT TODAY Some feelings of bloating in the abdomen.  Passage of more gas than usual.  Spotting of blood in your stool or on the toilet paper.  IF YOU HAD POLYPS REMOVED DURING THE COLONOSCOPY: No aspirin products for 7 days or as instructed.  No alcohol for 7 days or as instructed.  Eat a soft diet for the next 24 hours.  FINDING OUT THE RESULTS OF YOUR TEST Not all test results are available during your visit. If your test results are not back during the visit, make an appointment with your caregiver to find out the results. Do not assume everything is normal if you have not heard from your  caregiver or the medical facility. It is important for you to follow up on all of your test results.  SEEK IMMEDIATE MEDICAL ATTENTION IF: You have more than a spotting of blood in your stool.  Your belly is swollen (abdominal distention).  You are nauseated or vomiting.  You have a temperature over 101.  You have abdominal pain or discomfort that is severe or gets worse throughout the day.   Your colonoscopy revealed  polyp(s) which I removed successfully. Await pathology results, my office will contact you. I recommend repeating colonoscopy in 7 years for surveillance purposes, depending on pathology results.  You also have  internal hemorrhoids. I would recommend increasing fiber in your diet or adding OTC Benefiber/Metamucil. Be sure to drink at least 4 to 6 glasses of water daily. We can consider hemorrhoid banding for these.   Follow-up with GI in 6-8 weeks    I hope you have a great rest of your week!  Carlin POUR. Cindie, D.O. Gastroenterology and Hepatology [facility]

## 2024-04-11 ENCOUNTER — Ambulatory Visit: Admitting: Adult Health

## 2024-04-11 LAB — SURGICAL PATHOLOGY

## 2024-04-17 ENCOUNTER — Encounter (HOSPITAL_COMMUNITY): Payer: Self-pay | Admitting: Internal Medicine

## 2024-04-20 ENCOUNTER — Ambulatory Visit
Admission: RE | Admit: 2024-04-20 | Discharge: 2024-04-20 | Disposition: A | Discharge status: Home / Self Care | Attending: Family Medicine | Admitting: Family Medicine

## 2024-04-20 VITALS — BP 131/74 | HR 91 | Temp 98.6°F | Resp 20

## 2024-04-20 DIAGNOSIS — J209 Acute bronchitis, unspecified: Secondary | ICD-10-CM

## 2024-04-20 DIAGNOSIS — J3089 Other allergic rhinitis: Secondary | ICD-10-CM | POA: Diagnosis not present

## 2024-04-20 MED ORDER — PROMETHAZINE-DM 6.25-15 MG/5ML PO SYRP: 5.0000 mL | ORAL_SOLUTION | Freq: Four times a day (QID) | ORAL | 0 refills | Status: AC | PRN

## 2024-04-20 MED ORDER — DEXAMETHASONE SOD PHOSPHATE PF 10 MG/ML IJ SOLN
10.0000 mg | Freq: Once | INTRAMUSCULAR | Status: AC
  Administered 2024-04-20: 10 mg via INTRAMUSCULAR

## 2024-04-20 MED ORDER — ALBUTEROL SULFATE HFA 108 (90 BASE) MCG/ACT IN AERS: 2.0000 | INHALATION_SPRAY | RESPIRATORY_TRACT | 0 refills | Status: DC | PRN

## 2024-04-20 NOTE — Discharge Instructions (Signed)
 Continue your allergy  regimen, albuterol  as needed, Mucinex and other remedies and we have given you a steroid shot today and a cough syrup prescription.  Return for worsening or unresolving symptoms.

## 2024-04-20 NOTE — ED Triage Notes (Signed)
 Pt reports she has had a cough and nasal drainage x 2 weeks

## 2024-04-20 NOTE — ED Provider Notes (Signed)
 [facility]    CSN: [id number] Arrival date & time: 04/20/24  1654      History   Chief Complaint Chief Complaint  Patient presents with   Cough    No fever. - Entered by patient    HPI Jacqueline Parsons is a 53 y.o. female.   Patient presenting today with 2-week history of hacking cough, sinus drainage.  Denies wheezing, chest tightness, shortness of breath, fever, chills, abdominal pain, vomiting, diarrhea.  History of seasonal allergies and bronchitis.  Compliant with her allergy  regimen and is also using her albuterol  inhaler as needed with good temporary relief, Mucinex.     Past Medical History:  Diagnosis Date   Allergy  1996   Anxiety    Cervical cancer (HCC) 2002   Depression    Gastric bypass status for obesity 2015   Hypertension    Oxygen deficiency    Sleep apnea 2007   Thyroid  disease    Vaginal Pap smear, abnormal     Patient Active Problem List   Diagnosis Date Noted   Change in bowel function 03/15/2024   Rectal bleeding 03/15/2024   Hemorrhoid 03/15/2024   History of colon polyps 03/15/2024   Iron deficiency 03/08/2024   Hemorrhoids 02/02/2024   Constipation 09/03/2023   Persistent cough 11/20/2022   RLQ abdominal pain 09/08/2022   Perimenopause 09/22/2021   Sleep disturbance 09/22/2021   Moody 09/22/2021   Night sweats 09/22/2021   Hot flashes 09/22/2021   Encounter for annual routine gynecological examination 09/22/2021   Anxiety and depression 09/22/2021   S/P hysterectomy 09/22/2021   Encounter for screening fecal occult blood testing 09/18/2020   Encounter for well woman exam with routine gynecological exam 09/18/2020   Thyroid  disease 03/09/2019   History of cervical cancer 02/13/2019   Screening for colorectal cancer 02/13/2019   Vaginal Pap smear following hysterectomy for malignancy 02/13/2019   Dysphagia, idiopathic 01/04/2019   Swelling of right lower extremity 10/29/2017   Essential hypertension 10/29/2017    Hyperlipidemia 10/29/2017   Thyroid  nodule 10/13/2017   Adult hypothyroidism 10/13/2017   Chronic edema 11/24/2016   Metatarsalgia of right foot 11/24/2016   Stress reaction of bone 11/24/2016   BMI 45.0-49.9, adult (HCC) 2015    Past Surgical History:  Procedure Laterality Date   ABDOMINAL HYSTERECTOMY     arm reduction Bilateral 2021   CARPAL TUNNEL RELEASE Bilateral    COLONOSCOPY N/A 04/10/2024   Procedure: COLONOSCOPY;  Surgeon: Cindie Carlin POUR, DO;  [facility]: [facility];  Service: Endoscopy;  Laterality: N/A;  9:15 am, asa 3   ROUX-EN-Y PROCEDURE  2015    OB History     Gravida  0   Para  0   Term  0   Preterm  0   AB  0   Living  0      SAB  0   IAB  0   Ectopic  0   Multiple  0   Live Births  0            Home Medications    Prior to Admission medications   Medication Sig Start Date End Date Taking? Authorizing Provider  promethazine -dextromethorphan (PROMETHAZINE -DM) 6.25-15 MG/5ML syrup Take 5 mLs by mouth 4 (four) times daily as needed. 04/20/24  Yes Stuart Vernell Norris, PA-C  albuterol  (VENTOLIN  HFA) 108 (90 Base) MCG/ACT inhaler Inhale 2 puffs into the lungs every 4 (four) hours as needed for wheezing or shortness of breath. 04/20/24   Stuart,  Vernell Norris, PA-C  Ascorbic Acid (VITAMIN C PO) Take by mouth.    [provider]  calcium gluconate 500 MG tablet Take 1 tablet by mouth daily.    [provider]  cetirizine  (ZYRTEC ) 10 MG tablet Take 1 tablet (10 mg total) by mouth at bedtime. 12/04/22 03/15/24  Iva Marty Saltness, MD  Cholecalciferol (VITAMIN D3) 5000 units TABS Take by mouth daily.     [provider]  diltiazem  (DILT-XR) 240 MG 24 hr capsule Take 1 capsule (240 mg total) by mouth daily. 09/03/23   Melvenia Manus BRAVO, MD  diphenhydrAMINE HCl (BENADRYL PO) Take by mouth.    [provider]  fluticasone  (FLONASE ) 50 MCG/ACT nasal spray Place 1 spray into both nostrils daily. 08/05/23    Melvenia Manus BRAVO, MD  hydrocortisone  (ANUSOL -HC) 25 MG suppository Place 1 suppository (25 mg total) rectally 2 (two) times daily. 02/02/24   Signa Delon LABOR, NP  levothyroxine  (SYNTHROID ) 88 MCG tablet TAKE 1 TABLET(88 MCG) BY MOUTH DAILY 04/03/24   Bevely Doffing, FNP  lisinopril  (ZESTRIL ) 40 MG tablet Take 1 tablet (40 mg total) by mouth daily. 12/06/23   Bevely Doffing, FNP  lubiprostone  (AMITIZA ) 24 MCG capsule Take 1 capsule (24 mcg total) by mouth 2 (two) times daily with a meal. 03/15/24   Ezzard Sonny RAMAN, PA-C  pediatric multivitamin-iron (POLY-VI-SOL WITH IRON) 15 MG chewable tablet Chew 1 tablet by mouth daily.    [provider]  pravastatin  (PRAVACHOL ) 40 MG tablet Take 1 tablet (40 mg total) by mouth daily. 12/06/23   Bevely Doffing, FNP  senna-docusate (SENOKOT S) 8.6-50 MG tablet Take 1-2 tablets at bedtime as needed 02/02/24   Signa Delon LABOR, NP  tirzepatide  (ZEPBOUND ) 15 MG/0.5ML Pen Inject 15 mg into the skin once a week. 02/25/24 08/11/24  Bevely Doffing, FNP  valACYclovir  (VALTREX ) 500 MG tablet TAKE 1 TABLET BY MOUTH DAILY AS NEEDED. 01/21/24   Bevely Doffing, FNP  vitamin B-12 (CYANOCOBALAMIN) 500 MCG tablet Take by mouth.    [provider]    Family History Family History  Problem Relation Age of Onset   Alcohol abuse Mother    Alcohol abuse Father    Alcoholism Maternal Grandmother    Cancer Maternal Grandmother        female   Cancer Maternal Grandfather        lung   Hypertension Maternal Aunt    Thyroid  disease Neg Hx    Colon cancer Neg Hx    Colon polyps Neg Hx    Allergic rhinitis Neg Hx    Angioedema Neg Hx    Asthma Neg Hx    Eczema Neg Hx    Urticaria Neg Hx     Social History Social History   Tobacco Use   Smoking status: Never    Passive exposure: Never   Smokeless tobacco: Never  Vaping Use   Vaping status: Never Used  Substance Use Topics   Alcohol use: Yes    Comment: occ   Drug use: No     Allergies   Dust  mite extract   Review of Systems Review of Systems Per HPI  Physical Exam Triage Vital Signs ED Triage Vitals  Encounter Vitals Group     BP 04/20/24 1658 131/74     Girls Systolic BP Percentile --      Girls Diastolic BP Percentile --      Boys Systolic BP Percentile --      Boys Diastolic BP Percentile --  Pulse Rate 04/20/24 1658 91     Resp 04/20/24 1658 20     Temp 04/20/24 1658 98.6 F (37 C)     Temp Source 04/20/24 1658 Oral     SpO2 04/20/24 1658 97 %     Weight --      Height --      Head Circumference --      Peak Flow --      Pain Score 04/20/24 1659 0     Pain Loc --      Pain Education --      Exclude from Growth Chart --    No data found.  Updated Vital Signs BP 131/74 (BP Location: Right Arm)   Pulse 91   Temp 98.6 F (37 C) (Oral)   Resp 20   SpO2 97%   Visual Acuity Right Eye Distance:   Left Eye Distance:   Bilateral Distance:    Right Eye Near:   Left Eye Near:    Bilateral Near:     Physical Exam Vitals and nursing note reviewed.  Constitutional:      Appearance: Normal appearance.  HENT:     Head: Atraumatic.     Right Ear: Tympanic membrane and external ear normal.     Left Ear: Tympanic membrane and external ear normal.     Nose: Congestion and rhinorrhea present.     Mouth/Throat:     Mouth: Mucous membranes are moist.     Pharynx: Posterior oropharyngeal erythema present.  Eyes:     Extraocular Movements: Extraocular movements intact.     Conjunctiva/sclera: Conjunctivae normal.  Cardiovascular:     Rate and Rhythm: Normal rate and regular rhythm.     Heart sounds: Normal heart sounds.  Pulmonary:     Effort: Pulmonary effort is normal.     Breath sounds: Normal breath sounds. No wheezing or rales.  Musculoskeletal:        General: Normal range of motion.     Cervical back: Normal range of motion and neck supple.  Skin:    General: Skin is warm and dry.  Neurological:     Mental Status: She is alert and  oriented to person, place, and time.  Psychiatric:        Mood and Affect: Mood normal.        Thought Content: Thought content normal.      UC Treatments / Results  Labs (all labs ordered are listed, but only abnormal results are displayed) Labs Reviewed - No data to display  EKG   Radiology No results found.  Procedures Procedures (including critical care time)  Medications Ordered in UC Medications  dexamethasone  (DECADRON ) injection 10 mg (10 mg Intramuscular Given 04/20/24 1710)    Initial Impression / Assessment and Plan / UC Course  I have reviewed the triage vital signs and the nursing notes.  Pertinent labs & imaging results that were available during my care of the patient were reviewed by me and considered in my medical decision making (see chart for details).     Vitals and exam reassuring today, consistent with bronchitis secondary to seasonal allergy  exacerbation.  Will treat with IM Decadron , albuterol , Phenergan  DM, continued allergy  regimen and supportive over-the-counter medications and home care.  Return for worsening or unresolving symptoms.  Final Clinical Impressions(s) / UC Diagnoses   Final diagnoses:  Acute bronchitis, unspecified organism  Seasonal allergic rhinitis due to other allergic trigger     Discharge Instructions  Continue your allergy  regimen, albuterol  as needed, Mucinex and other remedies and we have given you a steroid shot today and a cough syrup prescription.  Return for worsening or unresolving symptoms.    ED Prescriptions     Medication Sig Dispense Auth. Provider   promethazine -dextromethorphan (PROMETHAZINE -DM) 6.25-15 MG/5ML syrup Take 5 mLs by mouth 4 (four) times daily as needed. 100 mL Stuart Vernell Norris, PA-C   albuterol  (VENTOLIN  HFA) 108 [phone] Base) MCG/ACT inhaler Inhale 2 puffs into the lungs every 4 (four) hours as needed for wheezing or shortness of breath. 18 g Stuart Vernell Norris, [place]       PDMP not reviewed this encounter.   Stuart Vernell Norris, PA-C 04/20/24 1712

## 2024-04-21 ENCOUNTER — Telehealth: Payer: Self-pay

## 2024-04-21 MED ORDER — ALBUTEROL SULFATE HFA 108 (90 BASE) MCG/ACT IN AERS: 2.0000 | INHALATION_SPRAY | RESPIRATORY_TRACT | 0 refills | Status: AC | PRN

## 2024-04-21 NOTE — Telephone Encounter (Signed)
 Pt states she was to have albuterol  and cough syrup, [facility] told pt they only received prescription for the cough medicine, albuterol  prescription has been re-sent.

## 2024-04-25 ENCOUNTER — Other Ambulatory Visit: Payer: Self-pay | Admitting: Internal Medicine

## 2024-05-09 ENCOUNTER — Other Ambulatory Visit (HOSPITAL_COMMUNITY): Payer: Self-pay

## 2024-05-09 DIAGNOSIS — Z1231 Encounter for screening mammogram for malignant neoplasm of breast: Secondary | ICD-10-CM

## 2024-05-15 ENCOUNTER — Encounter (HOSPITAL_COMMUNITY): Payer: Self-pay

## 2024-05-15 ENCOUNTER — Encounter (HOSPITAL_COMMUNITY)

## 2024-05-15 DIAGNOSIS — Z1231 Encounter for screening mammogram for malignant neoplasm of breast: Secondary | ICD-10-CM

## 2024-05-16 ENCOUNTER — Other Ambulatory Visit (HOSPITAL_COMMUNITY): Payer: Self-pay

## 2024-05-16 ENCOUNTER — Other Ambulatory Visit: Payer: Self-pay

## 2024-05-16 MED ORDER — ZEPBOUND 15 MG/0.5ML ~~LOC~~ SOAJ
15.0000 mg | SUBCUTANEOUS | 1 refills | Status: AC
  Filled 2024-05-16: qty 6, 84d supply, fill #0

## 2024-05-18 MED ORDER — TRULANCE 3 MG PO TABS
3.0000 mg | ORAL_TABLET | Freq: Every day | ORAL | 5 refills | Status: DC
  Filled 2024-05-18: qty 30, 30d supply, fill #0

## 2024-05-19 ENCOUNTER — Other Ambulatory Visit: Payer: Self-pay

## 2024-05-19 MED ORDER — TRULANCE 3 MG PO TABS: 3.0000 mg | ORAL_TABLET | Freq: Every day | ORAL | 5 refills | Status: AC

## 2024-05-19 NOTE — Addendum Note (Signed)
 Addended by: EZZARD SONNY RAMAN on: 05/19/2024 07:56 AM   Modules accepted: Orders

## 2024-05-25 ENCOUNTER — Ambulatory Visit: Admitting: Orthopedic Surgery

## 2024-05-25 ENCOUNTER — Encounter: Payer: Self-pay | Admitting: Orthopedic Surgery

## 2024-05-25 DIAGNOSIS — M23321 Other meniscus derangements, posterior horn of medial meniscus, right knee: Secondary | ICD-10-CM

## 2024-05-25 DIAGNOSIS — Z01818 Encounter for other preprocedural examination: Secondary | ICD-10-CM

## 2024-05-25 NOTE — Progress Notes (Signed)
 "  Jacqueline Parsons  05/25/2024    ASSESSMENT AND PLAN:     Encounter Diagnoses  Name Primary?   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  Options were continued nonoperative treatment versus surgical intervention and she opted for arthroscopy of the knee with partial medial meniscectomy  Low overall complication risk from this surgery although her weight may delay the timing of her recovery  We will have to do a meniscectomy as this is not a root tear And she does have the meniscal extrusion  Chief Complaint  Patient presents with   Knee Pain    Right    Jacqueline Parsons is 54 years old we last saw her after she got off a bus and missed the step.  She eventually had an MRI of the knee and it confirmed that she had mild arthritis of the joint along with torn medial meniscus.  She actually did well up until recently when she wore some high-heeled shoes and since that time the knee pain has increased the locking sensation has appeared and she is very concerned now.  Knee Pain       HISTORY SECTION :  Current Medications[1]  Allergies[2]   ROS currently no shortness of breath or chest pain   has a past medical history of Allergy  (1996), Anxiety, Cervical cancer (HCC) (2002), Depression, Gastric bypass status for obesity (2015), Hypertension, Oxygen deficiency, Sleep apnea (2007), Thyroid  disease, and Vaginal Pap smear, abnormal.   Past Surgical History:  Procedure Laterality Date   ABDOMINAL HYSTERECTOMY     arm reduction Bilateral 2021   CARPAL TUNNEL RELEASE Bilateral    COLONOSCOPY N/A 04/10/2024   Procedure: COLONOSCOPY;  Surgeon: Cindie Carlin POUR, DO;  Location: [facility];  Service: Endoscopy;  Laterality: N/A;  9:15 am, asa 3   ROUX-EN-Y PROCEDURE  2015    Social History   Socioeconomic History    Marital status: Married    Spouse name: Not on file   Number of children: Not on file   Years of education: Not on file   Highest education level: 12th grade  Occupational History   Occupation: [employer]  Tobacco Use   Smoking status: Never    Passive exposure: Never   Smokeless tobacco: Never  Vaping Use   Vaping status: Never Used  Substance and Sexual Activity   Alcohol use: Yes    Comment: occ   Drug use: No   Sexual activity: Not Currently    Birth control/protection: None    Comment: hyst  Other Topics Concern   Not on file  Social History Narrative   SINGLE-ENGAGED. NO KIDS. WORKS AS A TAX COLLECTOR. SPENDS FREE TIME: CRAFTING.   Social Drivers of Health   Tobacco Use: Low Risk (05/25/2024)   Patient History    Smoking Tobacco Use: Never    Smokeless Tobacco Use: Never    Passive Exposure: Never  Financial Resource Strain: Low Risk (02/28/2024)   Overall Financial Resource Strain (CARDIA)    Difficulty of Paying Living Expenses: Not very hard  Food Insecurity: No Food Insecurity (02/28/2024)   Epic    Worried About Programme Researcher, Broadcasting/film/video in the Last Year: Never true    Ran Out of Food in the Last Year: Never true  Transportation Needs: No Transportation Needs (02/28/2024)   Epic  Lack of Transportation (Medical): No    Lack of Transportation (Non-Medical): No  Physical Activity: Inactive (02/28/2024)   Exercise Vital Sign    Days of Exercise per Week: 0 days    Minutes of Exercise per Session: Not on file  Stress: Stress Concern Present (02/28/2024)   [employer] of Occupational Health - Occupational Stress Questionnaire    Feeling of Stress: Rather much  Social Connections: Socially Integrated (02/28/2024)   Social Connection and Isolation Panel    Frequency of Communication with Friends and Family: More than three times a week    Frequency of Social Gatherings with Friends and Family: Once a week    Attends Religious Services: More than 4 times per year     Active Member of [employer] or Organizations: Yes    Attends Engineer, Structural: More than 4 times per year    Marital Status: Married  Catering Manager Violence: Not At Risk (09/22/2021)   Humiliation, Afraid, Rape, and Kick questionnaire    Fear of Current or Ex-Partner: No    Emotionally Abused: No    Physically Abused: No    Sexually Abused: No  Depression (PHQ2-9): Low Risk (03/01/2024)   Depression (PHQ2-9)    PHQ-2 Score: 1  Alcohol Screen: Low Risk (02/28/2024)   Alcohol Screen    Last Alcohol Screening Score (AUDIT): 1  Housing: Low Risk (02/28/2024)   Epic    Unable to Pay for Housing in the Last Year: No    Number of Times Moved in the Last Year: 0    Homeless in the Last Year: No  Utilities: Not on file  Health Literacy: Medium Risk (01/13/2022)   Received from [facility]    How often do you need to have someone help you when you read instructions, pamphlets, or other written material from your doctor or pharmacy?: Rarely     Family History  Problem Relation Age of Onset   Alcohol abuse Mother    Alcohol abuse Father    Alcoholism Maternal Grandmother    Cancer Maternal Grandmother        female   Cancer Maternal Grandfather        lung   Hypertension Maternal Aunt    Thyroid  disease Neg Hx    Colon cancer Neg Hx    Colon polyps Neg Hx    Allergic rhinitis Neg Hx    Angioedema Neg Hx    Asthma Neg Hx    Eczema Neg Hx    Urticaria Neg Hx       PHYSICAL EXAM SECTION: There were no vitals taken for this visit.  There is no height or weight on file to calculate BMI.   General appearance: Well-developed well-nourished no gross deformities  Eyes clear normal vision no evidence of conjunctivitis or jaundice, extraocular muscles intact  ENT: ears hearing normal, nasal passages clear, throat clear   Neck is supple without palpable mass, full range of motion   Cardiovascular normal pulse and perfusion in all 4 extremities  normal color without edema  Lymph nodes: No lymphadenopathy  Neurologically deep tendon reflexes are equal and normal, no sensation loss or deficits no pathologic reflexes   Skin no lacerations or ulcerations no nodularity no palpable masses, no erythema or nodularity  Psychological: Awake alert and oriented x3 mood and affect normal  Musculoskeletal: Right knee medial joint line is tender knee extension lacks 3 degrees knee flexion is 110 degrees ligaments are stable McMurray's  is positive  Imaging: I personally read the images and my interpretation is  IMPRESSION: 1. Severe complex tear of the posterior horn of the medial meniscus with peripheral meniscal extrusion. Tiny radial tear of the free edge of the body of the medial meniscus. 2. Tricompartmental cartilage abnormalities as described above.     Electronically Signed   By: Julaine Blanch M.D.   On: 12/03/2022 15:36  5:24 PM  [name], MD        [1]  Current Outpatient Medications:    chlorthalidone  (HYGROTON ) 25 MG tablet, Take 25 mg by mouth daily., Disp: , Rfl:    albuterol  (VENTOLIN  HFA) 108 (90 Base) MCG/ACT inhaler, Inhale 2 puffs into the lungs every 4 (four) hours as needed for wheezing or shortness of breath., Disp: 18 g, Rfl: 0   Ascorbic Acid (VITAMIN C PO), Take by mouth., Disp: , Rfl:    calcium gluconate 500 MG tablet, Take 1 tablet by mouth daily., Disp: , Rfl:    cetirizine  (ZYRTEC ) 10 MG tablet, Take 1 tablet (10 mg total) by mouth at bedtime., Disp: 90 tablet, Rfl: 3   Cholecalciferol (VITAMIN D3) 5000 units TABS, Take by mouth daily. , Disp: , Rfl:    diltiazem  (DILT-XR) 240 MG 24 hr capsule, Take 1 capsule (240 mg total) by mouth daily., Disp: 90 capsule, Rfl: 3   diphenhydrAMINE HCl (BENADRYL PO), Take by mouth., Disp: , Rfl:    fluticasone  (FLONASE ) 50 MCG/ACT nasal spray, SHAKE LIQUID AND USE 1 SPRAY IN EACH NOSTRIL DAILY, Disp: 16 g, Rfl: 2   hydrocortisone  (ANUSOL -HC) 25 MG  suppository, Place 1 suppository (25 mg total) rectally 2 (two) times daily., Disp: 12 suppository, Rfl: 1   levothyroxine  (SYNTHROID ) 88 MCG tablet, TAKE 1 TABLET(88 MCG) BY MOUTH DAILY, Disp: 30 tablet, Rfl: 3   lisinopril  (ZESTRIL ) 40 MG tablet, Take 1 tablet (40 mg total) by mouth daily., Disp: 90 tablet, Rfl: 3   pediatric multivitamin-iron (POLY-VI-SOL WITH IRON) 15 MG chewable tablet, Chew 1 tablet by mouth daily., Disp: , Rfl:    Plecanatide  (TRULANCE ) 3 MG TABS, Take 1 tablet (3 mg total) by mouth daily., Disp: 30 tablet, Rfl: 5   pravastatin  (PRAVACHOL ) 40 MG tablet, Take 1 tablet (40 mg total) by mouth daily., Disp: 90 tablet, Rfl: 3   promethazine -dextromethorphan (PROMETHAZINE -DM) 6.25-15 MG/5ML syrup, Take 5 mLs by mouth 4 (four) times daily as needed., Disp: 100 mL, Rfl: 0   senna-docusate (SENOKOT S) 8.6-50 MG tablet, Take 1-2 tablets at bedtime as needed, Disp: , Rfl:    tirzepatide  (ZEPBOUND ) 15 MG/0.5ML Pen, Inject 15 mg into the skin once a week., Disp: 6 mL, Rfl: 1   valACYclovir  (VALTREX ) 500 MG tablet, TAKE 1 TABLET BY MOUTH DAILY AS NEEDED., Disp: 30 tablet, Rfl: 1   vitamin B-12 (CYANOCOBALAMIN) 500 MCG tablet, Take by mouth., Disp: , Rfl:  [2]  Allergies Allergen Reactions   Dust Mite Extract Cough, Hives and Itching   "

## 2024-05-25 NOTE — H&P (View-Only) (Signed)
 "  Jacqueline Parsons  05/25/2024    ASSESSMENT AND PLAN:     Encounter Diagnoses  Name Primary?   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  Options were continued nonoperative treatment versus surgical intervention and she opted for arthroscopy of the knee with partial medial meniscectomy  Low overall complication risk from this surgery although her weight may delay the timing of her recovery  We will have to do a meniscectomy as this is not a root tear And she does have the meniscal extrusion  Chief Complaint  Patient presents with   Knee Pain    Right    Jacqueline Parsons is 54 years old we last saw her after she got off a bus and missed the step.  She eventually had an MRI of the knee and it confirmed that she had mild arthritis of the joint along with torn medial meniscus.  She actually did well up until recently when she wore some high-heeled shoes and since that time the knee pain has increased the locking sensation has appeared and she is very concerned now.  Knee Pain       HISTORY SECTION :  Current Medications[1]  Allergies[2]   ROS currently no shortness of breath or chest pain   has a past medical history of Allergy  (1996), Anxiety, Cervical cancer (HCC) (2002), Depression, Gastric bypass status for obesity (2015), Hypertension, Oxygen deficiency, Sleep apnea (2007), Thyroid  disease, and Vaginal Pap smear, abnormal.   Past Surgical History:  Procedure Laterality Date   ABDOMINAL HYSTERECTOMY     arm reduction Bilateral 2021   CARPAL TUNNEL RELEASE Bilateral    COLONOSCOPY N/A 04/10/2024   Procedure: COLONOSCOPY;  Surgeon: Cindie Carlin POUR, DO;  Location: [facility];  Service: Endoscopy;  Laterality: N/A;  9:15 am, asa 3   ROUX-EN-Y PROCEDURE  2015    Social History   Socioeconomic History    Marital status: Married    Spouse name: Not on file   Number of children: Not on file   Years of education: Not on file   Highest education level: 12th grade  Occupational History   Occupation: [employer]  Tobacco Use   Smoking status: Never    Passive exposure: Never   Smokeless tobacco: Never  Vaping Use   Vaping status: Never Used  Substance and Sexual Activity   Alcohol use: Yes    Comment: occ   Drug use: No   Sexual activity: Not Currently    Birth control/protection: None    Comment: hyst  Other Topics Concern   Not on file  Social History Narrative   SINGLE-ENGAGED. NO KIDS. WORKS AS A TAX COLLECTOR. SPENDS FREE TIME: CRAFTING.   Social Drivers of Health   Tobacco Use: Low Risk (05/25/2024)   Patient History    Smoking Tobacco Use: Never    Smokeless Tobacco Use: Never    Passive Exposure: Never  Financial Resource Strain: Low Risk (02/28/2024)   Overall Financial Resource Strain (CARDIA)    Difficulty of Paying Living Expenses: Not very hard  Food Insecurity: No Food Insecurity (02/28/2024)   Epic    Worried About Programme Researcher, Broadcasting/film/video in the Last Year: Never true    Ran Out of Food in the Last Year: Never true  Transportation Needs: No Transportation Needs (02/28/2024)   Epic  Lack of Transportation (Medical): No    Lack of Transportation (Non-Medical): No  Physical Activity: Inactive (02/28/2024)   Exercise Vital Sign    Days of Exercise per Week: 0 days    Minutes of Exercise per Session: Not on file  Stress: Stress Concern Present (02/28/2024)   [employer] of Occupational Health - Occupational Stress Questionnaire    Feeling of Stress: Rather much  Social Connections: Socially Integrated (02/28/2024)   Social Connection and Isolation Panel    Frequency of Communication with Friends and Family: More than three times a week    Frequency of Social Gatherings with Friends and Family: Once a week    Attends Religious Services: More than 4 times per year     Active Member of [employer] or Organizations: Yes    Attends Engineer, Structural: More than 4 times per year    Marital Status: Married  Catering Manager Violence: Not At Risk (09/22/2021)   Humiliation, Afraid, Rape, and Kick questionnaire    Fear of Current or Ex-Partner: No    Emotionally Abused: No    Physically Abused: No    Sexually Abused: No  Depression (PHQ2-9): Low Risk (03/01/2024)   Depression (PHQ2-9)    PHQ-2 Score: 1  Alcohol Screen: Low Risk (02/28/2024)   Alcohol Screen    Last Alcohol Screening Score (AUDIT): 1  Housing: Low Risk (02/28/2024)   Epic    Unable to Pay for Housing in the Last Year: No    Number of Times Moved in the Last Year: 0    Homeless in the Last Year: No  Utilities: Not on file  Health Literacy: Medium Risk (01/13/2022)   Received from [facility]    How often do you need to have someone help you when you read instructions, pamphlets, or other written material from your doctor or pharmacy?: Rarely     Family History  Problem Relation Age of Onset   Alcohol abuse Mother    Alcohol abuse Father    Alcoholism Maternal Grandmother    Cancer Maternal Grandmother        female   Cancer Maternal Grandfather        lung   Hypertension Maternal Aunt    Thyroid  disease Neg Hx    Colon cancer Neg Hx    Colon polyps Neg Hx    Allergic rhinitis Neg Hx    Angioedema Neg Hx    Asthma Neg Hx    Eczema Neg Hx    Urticaria Neg Hx       PHYSICAL EXAM SECTION: There were no vitals taken for this visit.  There is no height or weight on file to calculate BMI.   General appearance: Well-developed well-nourished no gross deformities  Eyes clear normal vision no evidence of conjunctivitis or jaundice, extraocular muscles intact  ENT: ears hearing normal, nasal passages clear, throat clear   Neck is supple without palpable mass, full range of motion   Cardiovascular normal pulse and perfusion in all 4 extremities  normal color without edema  Lymph nodes: No lymphadenopathy  Neurologically deep tendon reflexes are equal and normal, no sensation loss or deficits no pathologic reflexes   Skin no lacerations or ulcerations no nodularity no palpable masses, no erythema or nodularity  Psychological: Awake alert and oriented x3 mood and affect normal  Musculoskeletal: Right knee medial joint line is tender knee extension lacks 3 degrees knee flexion is 110 degrees ligaments are stable McMurray's  is positive  Imaging: I personally read the images and my interpretation is  IMPRESSION: 1. Severe complex tear of the posterior horn of the medial meniscus with peripheral meniscal extrusion. Tiny radial tear of the free edge of the body of the medial meniscus. 2. Tricompartmental cartilage abnormalities as described above.     Electronically Signed   By: Julaine Blanch M.D.   On: 12/03/2022 15:36  5:24 PM  [name], MD        [1]  Current Outpatient Medications:    chlorthalidone  (HYGROTON ) 25 MG tablet, Take 25 mg by mouth daily., Disp: , Rfl:    albuterol  (VENTOLIN  HFA) 108 (90 Base) MCG/ACT inhaler, Inhale 2 puffs into the lungs every 4 (four) hours as needed for wheezing or shortness of breath., Disp: 18 g, Rfl: 0   Ascorbic Acid (VITAMIN C PO), Take by mouth., Disp: , Rfl:    calcium gluconate 500 MG tablet, Take 1 tablet by mouth daily., Disp: , Rfl:    cetirizine  (ZYRTEC ) 10 MG tablet, Take 1 tablet (10 mg total) by mouth at bedtime., Disp: 90 tablet, Rfl: 3   Cholecalciferol (VITAMIN D3) 5000 units TABS, Take by mouth daily. , Disp: , Rfl:    diltiazem  (DILT-XR) 240 MG 24 hr capsule, Take 1 capsule (240 mg total) by mouth daily., Disp: 90 capsule, Rfl: 3   diphenhydrAMINE HCl (BENADRYL PO), Take by mouth., Disp: , Rfl:    fluticasone  (FLONASE ) 50 MCG/ACT nasal spray, SHAKE LIQUID AND USE 1 SPRAY IN EACH NOSTRIL DAILY, Disp: 16 g, Rfl: 2   hydrocortisone  (ANUSOL -HC) 25 MG  suppository, Place 1 suppository (25 mg total) rectally 2 (two) times daily., Disp: 12 suppository, Rfl: 1   levothyroxine  (SYNTHROID ) 88 MCG tablet, TAKE 1 TABLET(88 MCG) BY MOUTH DAILY, Disp: 30 tablet, Rfl: 3   lisinopril  (ZESTRIL ) 40 MG tablet, Take 1 tablet (40 mg total) by mouth daily., Disp: 90 tablet, Rfl: 3   pediatric multivitamin-iron (POLY-VI-SOL WITH IRON) 15 MG chewable tablet, Chew 1 tablet by mouth daily., Disp: , Rfl:    Plecanatide  (TRULANCE ) 3 MG TABS, Take 1 tablet (3 mg total) by mouth daily., Disp: 30 tablet, Rfl: 5   pravastatin  (PRAVACHOL ) 40 MG tablet, Take 1 tablet (40 mg total) by mouth daily., Disp: 90 tablet, Rfl: 3   promethazine -dextromethorphan (PROMETHAZINE -DM) 6.25-15 MG/5ML syrup, Take 5 mLs by mouth 4 (four) times daily as needed., Disp: 100 mL, Rfl: 0   senna-docusate (SENOKOT S) 8.6-50 MG tablet, Take 1-2 tablets at bedtime as needed, Disp: , Rfl:    tirzepatide  (ZEPBOUND ) 15 MG/0.5ML Pen, Inject 15 mg into the skin once a week., Disp: 6 mL, Rfl: 1   valACYclovir  (VALTREX ) 500 MG tablet, TAKE 1 TABLET BY MOUTH DAILY AS NEEDED., Disp: 30 tablet, Rfl: 1   vitamin B-12 (CYANOCOBALAMIN) 500 MCG tablet, Take by mouth., Disp: , Rfl:  [2]  Allergies Allergen Reactions   Dust Mite Extract Cough, Hives and Itching   "

## 2024-05-25 NOTE — Patient Instructions (Signed)
 Your surgery will be at [facility] by Dr Margrette on 06/20/24  STOP ZEPBOUND  FOR 8 DAYS   The hospital will contact you with a preoperative appointment to discuss Anesthesia.  Please arrive on time or 15 minutes early for the preoperative appointment, they have a very tight schedule if you are late or do not come in your surgery will be cancelled.  The phone number is [phone]. Please bring your medications with you for the appointment. They will tell you the arrival time and medication instructions when you have your preoperative evaluation. Do not wear nail polish the day of your surgery and if you take Phentermine you need to stop this medication ONE WEEK prior to your surgery. If you take Invokana, Farxiga, Jardiance, or Steglatro) - Hold 72 hours before the procedure.  If you take Ozempic,  Mounjaro , Bydureon or Trulicity do not take for 8 days before your surgery. If you take Victoza, Rybelsis, Saxenda or Adlyxi stop 24 hours before the procedure.  Please arrive at the hospital 2 hours before procedure if scheduled at 9:30 or later in the day or at the time the nurse tells you at your preoperative visit.   If you have my chart do not use the time given in my chart use the time given to you by the nurse during your preoperative visit.   Your surgery  time may change. Please be available for phone calls the day of your surgery and the day before. The Short Stay department may need to discuss changes about your surgery time. Not reaching the you could lead to procedure delays and possible cancellation.  You must have a ride home and someone to stay with you for 24 to 48 hours. The person taking you home will receive and sign for the your discharge instructions.  Please be prepared to give your support persons name and telephone number to Central Registration. Dr Margrette will need that name and phone number post procedure.

## 2024-05-25 NOTE — Progress Notes (Signed)
" ° ° °  05/25/2024   Chief Complaint  Patient presents with   Knee Pain    Right     No diagnosis found.  What pharmacy do you use ? [address]__  DOI/DOS/ Date: was better but pain increased beginning of December  Did you get better, worse or no change (Answer below)   Worse / wore heels to a party in early December pain has increased since locking also       "

## 2024-05-26 ENCOUNTER — Other Ambulatory Visit (HOSPITAL_COMMUNITY): Payer: Self-pay

## 2024-05-26 DIAGNOSIS — Z1231 Encounter for screening mammogram for malignant neoplasm of breast: Secondary | ICD-10-CM

## 2024-05-29 ENCOUNTER — Encounter (HOSPITAL_COMMUNITY): Payer: Self-pay

## 2024-05-29 ENCOUNTER — Ambulatory Visit (HOSPITAL_COMMUNITY)
Admission: RE | Admit: 2024-05-29 | Discharge: 2024-05-29 | Disposition: A | Discharge status: Home / Self Care | Source: Ambulatory Visit

## 2024-05-29 DIAGNOSIS — Z1231 Encounter for screening mammogram for malignant neoplasm of breast: Secondary | ICD-10-CM | POA: Insufficient documentation

## 2024-06-05 NOTE — Progress Notes (Unsigned)
 "  GI Office Note    Referring Provider: Bevely Doffing, FNP Primary Care Physician:  Bevely Doffing, FNP Primary Gastroenterologist: Carlin POUR. Cindie, DO  Date:  06/06/2024  ID:  Levy Louder, DOB [birth year], MRN [id number]  Chief Complaint   Chief Complaint  Patient presents with   Hemorrhoids    Hemorrhoid banding. Trulance  not working    History of Present Illness  Jacqueline Parsons is a 54 y.o. female with a history of hemorrhoids, constipation, sleep apnea, thyroid  disease, anxiety/depression, cervical cancer, and obesity s/p gastric bypass in 2015 presenting today for hemorrhoid banding and discuss ongoing constipation.  Colonoscopy 03/2019: -one ascending colon polyp removed.    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      No adenomatous dysplasia or malignancy identified.    Diagnosis Comment  The polyp shows a loose, bland spindle cell proliferation that disrupts the normal crypt spacing.  There is also some myxoid change.  While not the most classic appearing Schwann cell hamartoma, the spindle cell morphology and patchy staining with S100 and Sox-10 support the diagnosis.  Schwann cell hamartomas are benign lesions that should be cured with polypectomy.  Immunohistochemistry is performed on block A1.  The controls are adequate and representative tissue is present for evaluation.  S-100:  Highlights lesional cells. Sox-10:  Highlights lesional cells. Dog-1:  Negative. CD34:  Negative in the lesional cells; highlights normal endothelial cells. EMA:  Negative in the lesional cells.  These tests were developed and their performance characteristics determined by [facility].  They have not been cleared or approved by the U.S. Food and Drug Administration.  The FDA has determined that such clearance or approval is not necessary.   These tests are used for clinical purposes.  They should not be regarded as investigational or for research.  This laboratory is certified under the Clinical Laboratory Improvement Amendments of 1988 (CLIA) as qualified to perform high complexity clinical laboratory testing.    Last office visit 03/15/2024 with Sonny Kerns, PA-C.  Having issues with constipation since starting Zepbound .  Constipation usually managed with diet in the past but having infrequent bowel movements once every 2 weeks unless she uses laxatives.  Had been using senna 2 tablets nightly which was ineffective and had been taking some Dulcolax up to 5 to 6 mg tablets in a day to have a bowel movement.  She admitted to insufficient water intake and often sipping on coffee throughout the day.  Keeping a water bottle at her work to try to remind her to drink.  Hemorrhoids have been exacerbated by her constipation and had been having hemorrhoids that were painful and bleeding.  Anusol  suppositories have been helpful and also using medicated wipes to help calm. Colonoscopy, anusol  and/or medicated wipes. Discontinue dulcolax and start amitiza  24 mcg BID with food.  Colonoscopy 04/10/2024: - Nonbleeding internal hemorrhoids - 5 mm polyp in the transverse colon - Exam otherwise normal - Path: Inflammatory polyp/granulation tissue negative for dysplasia - Advised to follow-up in the clinic in 8 weeks and consideration for hemorrhoid banding. - Surveillance colonoscopy in 7 years.  05/12/2024 patient reported she was still going weeks without bowel movements and asked to try something different.  She was sent in Trulance  in place of Amitiza .  Today:  Has been having issues with rectal bleeding as well as burning sensation in regards to her hemorrhoids.  She reports that they do prolapse and sometimes she has to push them back in herself.  She does use hemorrhoid suppositories as well as medicated wipes to help with discomfort and  states this does provide her some relief.   She notes that she has had some chronic constipation in the past however definitely since starting Mounjaro  it has worsened significantly.  She has been on the Trulance  for about a week and has seen this only some mild improvement, still having symptoms today and took some Dulcolax the other day.  She has not tried anything else in addition to the Trulance  at this time.  Did try Amitiza  which she did not find helpful either.  She does admit to frequent straining.  Wt Readings from Last 6 Encounters:  06/06/24 267 lb (121.1 kg)  04/07/24 261 lb (118.4 kg)  03/15/24 266 lb 12.8 oz (121 kg)  03/01/24 260 lb 1.9 oz (118 kg)  02/02/24 266 lb 8 oz (120.9 kg)  10/29/23 272 lb 8 oz (123.6 kg)   Body mass index is 45.83 kg/m.  Current Outpatient Medications  Medication Sig Dispense Refill   albuterol  (VENTOLIN  HFA) 108 (90 Base) MCG/ACT inhaler Inhale 2 puffs into the lungs every 4 (four) hours as needed for wheezing or shortness of breath. 18 g 0   Ascorbic Acid (VITAMIN C PO) Take by mouth.     calcium gluconate 500 MG tablet Take 1 tablet by mouth daily.     cetirizine  (ZYRTEC ) 10 MG tablet Take 1 tablet (10 mg total) by mouth at bedtime. 90 tablet 3   chlorthalidone  (HYGROTON ) 25 MG tablet Take 25 mg by mouth daily.     Cholecalciferol (VITAMIN D3) 5000 units TABS Take by mouth daily.      diltiazem  (DILT-XR) 240 MG 24 hr capsule Take 1 capsule (240 mg total) by mouth daily. 90 capsule 3   diphenhydrAMINE HCl (BENADRYL PO) Take by mouth.     fluticasone  (FLONASE ) 50 MCG/ACT nasal spray SHAKE LIQUID AND USE 1 SPRAY IN EACH NOSTRIL DAILY 16 g 2   hydrocortisone  (ANUSOL -HC) 25 MG suppository Place 1 suppository (25 mg total) rectally 2 (two) times daily. 12 suppository 1   levothyroxine  (SYNTHROID ) 88 MCG tablet TAKE 1 TABLET(88 MCG) BY MOUTH DAILY 30 tablet 3   lisinopril  (ZESTRIL ) 40 MG tablet Take 1 tablet (40 mg total) by mouth daily. 90 tablet 3    pediatric multivitamin-iron (POLY-VI-SOL WITH IRON) 15 MG chewable tablet Chew 1 tablet by mouth daily.     Plecanatide  (TRULANCE ) 3 MG TABS Take 1 tablet (3 mg total) by mouth daily. 30 tablet 5   pravastatin  (PRAVACHOL ) 40 MG tablet Take 1 tablet (40 mg total) by mouth daily. 90 tablet 3   promethazine -dextromethorphan (PROMETHAZINE -DM) 6.25-15 MG/5ML syrup Take 5 mLs by mouth 4 (four) times daily as needed. 100 mL 0   senna-docusate (SENOKOT S) 8.6-50 MG tablet Take 1-2 tablets at bedtime as needed     tirzepatide  (ZEPBOUND ) 15 MG/0.5ML Pen Inject 15 mg into the skin once a week. 6 mL 1   valACYclovir  (VALTREX ) 500 MG tablet TAKE 1 TABLET BY MOUTH DAILY AS NEEDED. 30 tablet 1   vitamin B-12 (CYANOCOBALAMIN) 500 MCG tablet Take by mouth.     No current facility-administered medications for this visit.    Past Medical History:  Diagnosis Date   Allergy  1996   Anxiety    Cervical cancer (HCC) 2002   Depression    Gastric bypass status for  obesity 2015   Hypertension    Oxygen deficiency    Sleep apnea 2007   Thyroid  disease    Vaginal Pap smear, abnormal     Past Surgical History:  Procedure Laterality Date   ABDOMINAL HYSTERECTOMY     arm reduction Bilateral 2021   CARPAL TUNNEL RELEASE Bilateral    COLONOSCOPY N/A 04/10/2024   Procedure: COLONOSCOPY;  Surgeon: Cindie Carlin POUR, DO;  Location: [facility];  Service: Endoscopy;  Laterality: N/A;  9:15 am, asa 3   COLONOSCOPY     ROUX-EN-Y PROCEDURE  2015    Family History  Problem Relation Age of Onset   Alcohol abuse Mother    Alcohol abuse Father    Alcoholism Maternal Grandmother    Cancer Maternal Grandmother        female   Cancer Maternal Grandfather        lung   Hypertension Maternal Aunt    Thyroid  disease Neg Hx    Colon cancer Neg Hx    Colon polyps Neg Hx    Allergic rhinitis Neg Hx    Angioedema Neg Hx    Asthma Neg Hx    Eczema Neg Hx    Urticaria Neg Hx     Allergies as of 06/06/2024 -  Review Complete 06/06/2024  Allergen Reaction Noted   Dust mite extract Cough, Hives, and Itching 12/04/2022    Social History   Socioeconomic History   Marital status: Married    Spouse name: Not on file   Number of children: Not on file   Years of education: Not on file   Highest education level: 12th grade  Occupational History   Occupation: [employer]  Tobacco Use   Smoking status: Never    Passive exposure: Never   Smokeless tobacco: Never  Vaping Use   Vaping status: Never Used  Substance and Sexual Activity   Alcohol use: Yes    Comment: occ   Drug use: No   Sexual activity: Not Currently    Birth control/protection: None    Comment: hyst  Other Topics Concern   Not on file  Social History Narrative   SINGLE-ENGAGED. NO KIDS. WORKS AS A TAX COLLECTOR. SPENDS FREE TIME: CRAFTING.   Social Drivers of Health   Tobacco Use: Low Risk (06/06/2024)   Patient History    Smoking Tobacco Use: Never    Smokeless Tobacco Use: Never    Passive Exposure: Never  Financial Resource Strain: Low Risk (02/28/2024)   Overall Financial Resource Strain (CARDIA)    Difficulty of Paying Living Expenses: Not very hard  Food Insecurity: No Food Insecurity (02/28/2024)   Epic    Worried About Programme Researcher, Broadcasting/film/video in the Last Year: Never true    Ran Out of Food in the Last Year: Never true  Transportation Needs: No Transportation Needs (02/28/2024)   Epic    Lack of Transportation (Medical): No    Lack of Transportation (Non-Medical): No  Physical Activity: Inactive (02/28/2024)   Exercise Vital Sign    Days of Exercise per Week: 0 days    Minutes of Exercise per Session: Not on file  Stress: Stress Concern Present (02/28/2024)   [employer] of Occupational Health - Occupational Stress Questionnaire    Feeling of Stress: Rather much  Social Connections: Socially Integrated (02/28/2024)   Social Connection and Isolation Panel    Frequency of Communication with Friends and Family:  More than three times a week    Frequency of Social  Gatherings with Friends and Family: Once a week    Attends Religious Services: More than 4 times per year    Active Member of Clubs or Organizations: Yes    Attends Banker Meetings: More than 4 times per year    Marital Status: Married  Depression (PHQ2-9): Low Risk (03/01/2024)   Depression (PHQ2-9)    PHQ-2 Score: 1  Alcohol Screen: Low Risk (02/28/2024)   Alcohol Screen    Last Alcohol Screening Score (AUDIT): 1  Housing: Low Risk (02/28/2024)   Epic    Unable to Pay for Housing in the Last Year: No    Number of Times Moved in the Last Year: 0    Homeless in the Last Year: No  Utilities: Not on file  Health Literacy: Medium Risk (01/13/2022)   Received from [facility]    How often do you need to have someone help you when you read instructions, pamphlets, or other written material from your doctor or pharmacy?: Rarely    Review of Systems   Gen: Denies fever, chills, anorexia. Denies fatigue, weakness, weight loss.  CV: Denies chest pain, palpitations, syncope, peripheral edema, and claudication. Resp: Denies dyspnea at rest, cough, wheezing, coughing up blood, and pleurisy. GI: See HPI Derm: Denies rash, itching, dry skin Psych: Denies depression, anxiety, memory loss, confusion. No homicidal or suicidal ideation.  Heme: Denies bruising, bleeding, and enlarged lymph nodes.  Physical Exam   BP 122/86 (BP Location: Right Arm, Patient Position: Sitting, Cuff Size: Large)   Pulse 71   Temp 97.9 F (36.6 C) (Temporal)   Ht 5' 4 (1.626 m)   Wt 267 lb (121.1 kg)   BMI 45.83 kg/m   General:   Alert and oriented. No distress noted. Pleasant and cooperative.  Head:  Normocephalic and atraumatic. Eyes:  Conjuctiva clear without scleral icterus. Rectal: Chaperone present - Charmaine Patch, CMA  [facility] Banding Procedure Note:   The patient presents with symptomatic grade 3 hemorrhoids,  unresponsive to maximal medical therapy, requesting rubber band ligation of his/her hemorrhoidal disease. All risks, benefits, and alternative forms of therapy were described and informed consent was obtained.  In the left lateral decubitus position anoscopic examination revealed grade 3 hemorrhoids in the right anterior  position, unable to visualize the left lateral or right posterior column well given patient discomfort.   The decision was made to band the right anterior internal hemorrhoid, and the [facility] System was used to perform band ligation without complication. Digital anorectal examination was then performed to assure proper positioning of the band, and to adjust the banded tissue as required. The patient was discharged home without pain or other issues. Dietary and behavioral recommendations were given along with follow-up instructions.   No complications were encountered and the patient tolerated the procedure well.   Msk:  Symmetrical without gross deformities. Normal posture. Extremities:  Without edema. Neurologic:  Alert and  oriented x4 Psych:  Alert and cooperative. Normal mood and affect.  Assessment & Plan  Jacqueline Parsons is a 54 y.o. female presenting today to discuss her constipation and hemorrhoid banding.   Grade 3 hemorrhoids and rectal bleeding Evidence of large internal hemorrhoids at time of recent colonoscopy.  Has had intermittent bleeding as well as burning sensation and pain in regards to her hemorrhoids.  Hemorrhoids are grade 3, they prolapse at times and have to be manually reduced.  Underwent hemorrhoid banding today in the office. - Continue hemorrhoid creams  and medicated wipes - Avoid straining - Continue control of constipation as per below - Will plan for additional hemorrhoid banding appointments in the near future.  Constipation, chronic and possible drug induced Has tried multiple pharmacologic treatments including amitiza  as well as  Dulcolax and other stool softener.  Has some mild chronic constipation previously that she was able to control with diet and water intake however now even though she is not drinking as much water as she should she is having significantly more trouble.  Mounjaro  likely significantly worsening symptoms. -Continue trial of Trulance , please give it a full 2 weeks alone to see how well it works. - If not noting any significant improvement after a full 2 weeks then advised to add a capful of MiraLAX daily to Trulance  - May continue Dulcolax as needed for relief but would prefer just Trulance  and MiraLAX if needed.   Follow up   Follow up about 3 weeks for hemorrhoid recheck.     Charmaine Melia, MSN, FNP-BC, AGACNP-BC [facility] "

## 2024-06-06 ENCOUNTER — Ambulatory Visit: Admitting: Gastroenterology

## 2024-06-06 ENCOUNTER — Encounter: Payer: Self-pay | Admitting: Gastroenterology

## 2024-06-06 VITALS — BP 122/86 | HR 71 | Temp 97.9°F | Ht 64.0 in | Wt 267.0 lb

## 2024-06-06 DIAGNOSIS — K642 Third degree hemorrhoids: Secondary | ICD-10-CM

## 2024-06-06 DIAGNOSIS — K625 Hemorrhage of anus and rectum: Secondary | ICD-10-CM | POA: Diagnosis not present

## 2024-06-06 DIAGNOSIS — K5909 Other constipation: Secondary | ICD-10-CM

## 2024-06-06 DIAGNOSIS — K5903 Drug induced constipation: Secondary | ICD-10-CM

## 2024-06-06 NOTE — Patient Instructions (Signed)
 Continue to avoid straining. Limit toilet time to 2-3 minutes at the most.   Avoid constipation. Continue Trulance . If after 1 more week on Trulance  you are not having any significant improvement in constipation then add miralax once daily to your trulance .  Occasionally, you may have more bleeding than usual after the banding procedure. This is often from the untreated hemorrhoids rather than the treated one. Don't be concerned if there is a tablespoon or so of blood. If there is more blood than this, lie flat with your bottom higher than your head and apply an ice pack to the area. If the bleeding does not stop within a half an hour or if you feel faint, have severe pain, chills, fever or difficulty passing urine (very rare) or other problems, you should call us  at [phone] or report to the nearest emergency room. Please call me with any concerns!  The procedure you have had should have been relatively painless since the banding of the area involved does not have nerve endings and there is no pain sensation. The rubber band cuts off the blood supply to the hemorrhoid and the band may fall off as soon as 48 hours after the banding (the band may occasionally be seen in the toilet bowl following a bowel movement). You may notice a temporary feeling of fullness in the rectum which should respond adequately to plain Tylenol or Motrin .  I will see you back in about 3 weeks for possible additional banding.   Charmaine Melia, MSN, FNP-BC, AGACNP-BC [facility]

## 2024-06-14 NOTE — Patient Instructions (Signed)
 "   Jacqueline Parsons  06/14/2024     @PREFPERIOPPHARMACY @   Your procedure is scheduled on 06/20/2024.   [facility] to [facility] at 8:20 A.M.   Call this number if you have problems the morning of surgery:  [phone]  If you experience any cold or flu symptoms such as cough, fever, chills, shortness of breath, etc. between now and your scheduled surgery, please notify us  at the above number.   Remember:   Do not eat or drink after midnight.       Take these medicines the morning of surgery with A SIP OF WATER : Zyrtec  Diltiazem  Synthroid    Last dose of Zepbound  should be on 06/12/2024 or before.    Please use your Albuterol  inhaler before coming to the hospital.     Do not wear jewelry, make-up or nail polish, including gel polish,  artificial nails, or any other type of covering on natural nails (fingers and  toes).  Do not wear lotions, powders, or perfumes, or deodorant.  Do not shave 48 hours prior to surgery.  Men may shave face and neck.  Do not bring valuables to the hospital.  [facility] is not responsible for any belongings or valuables.  Contacts, dentures or bridgework may not be worn into surgery.  Leave your suitcase in the car.  After surgery it may be brought to your room.  For patients admitted to the hospital, discharge time will be determined by your treatment team.  Patients discharged the day of surgery will not be allowed to drive home.   Name and phone number of your driver:   Family Special instructions:  N/A  Please read over the following fact sheets that you were given. Care and Recovery After Surgery    Surgery to See or Treat a Knee Problem (Knee Arthroscopy): What to Expect Knee arthroscopy is a surgery to check the inside of the knee and repair any damaged cartilage, ligaments, and other soft tissues. You may have this surgery if other treatments have not relieved your symptoms. Knee arthroscopy may be used to: Diagnose an injury to soft  tissue. Repair a torn ligament or other torn tissue. Take out bone fragments. Treat kneecap problems. Treat a very bad infection of the knee. This surgery is done using a thin tube that has a light and camera (arthroscope). The arthroscope is placed through a small cut in the knee. The camera sends images to a screen in the operating room. The images are used to help do the surgery. Tell a health care provider about: Any allergies you have. All medicines you take. These also includes vitamins, herbs, eye drops and creams you use. Any problems you or family members have had with medicines that make you fall asleep for surgery. Any bleeding problems you have. Any surgeries you have had. Any medical problems you have. Whether you're pregnant or may be pregnant. What are the risks? Your health care provider will talk with you about risks. These may include: Infection. Bleeding. Allergies to medicines. Damage to nerves and other parts of the knee. Blood clots. Failure of the surgery to treat your symptoms. Stiffness of the knee. What happens before the procedure? When to stop eating and drinking Eat and drink only as you've been told. You may be told this: 8 hours before your surgery Stop eating most foods. Do not eat meat, fried foods, or fatty foods. Eat only light foods, such as toast or crackers. All liquids are OK except energy  drinks and alcohol. 6 hours before your surgery Stop eating. Drink only clear liquids, such as water, clear fruit juice, black coffee, plain tea, and sports drinks. Do not drink energy drinks or alcohol. 2 hours before your procedure Stop drinking all liquids. You may be allowed to take medicines with small sips of water. If you do not eat and drink as told, your surgery may be delayed or canceled. Medicines Ask about changing or stopping: Any medicines you take. Any vitamins, herbs, or supplements you take. Do not take aspirin or ibuprofen  unless  you're told to. Surgery safety For your safety, you may: Need to wash your skin with a soap that kills germs. Have your surgery site marked. Get antibiotics. Have hair removed at the surgery site. General instructions Do not smoke, vape, or use nicotine or tobacco for at least 4 weeks before the surgery. Plan to have a responsible adult: Drive you home from the hospital or clinic. You won't be allowed to drive. Stay with you for the time you're told. What happens during the procedure?  An IV will be put into a vein in your hand or arm. You may be given: A sedative to help you relax. Anesthesia to keep you from feeling pain. A cuff may be placed around your upper leg. This will slow blood flow to your lower leg during the procedure. Several small cuts will be made around your knee. A salt-water (saline) solution will be put into one of the cuts. This expands the knee joint and clears away any blood. This will let the surgeon see your knee more clearly. An arthroscope will be passed into your knee through one of the cuts. Other instruments will be passed through the other cuts. Your surgeon will check and repair your knee as needed. Your cuts will be closed with adhesive tape, stitches, or skin glue. The area will be covered with a bandage and a brace. These steps may vary. Ask what you can expect. What happens after the procedure? You will be watched closely until you leave. This includes checking your pain level, blood pressure, heart rate, and breathing rate. You will be given medicines for pain. Where to find more information American Academy [employer]: orthoinfo.aaos.org This information is not intended to replace advice given to you by your health care provider. Make sure you discuss any questions you have with your health care provider. Document Revised: 11/02/2022 Document Reviewed: 11/02/2022 Elsevier Patient Education  2024 [employer] Inc.  How to Use  Chlorhexidine at Home in the Shower Chlorhexidine gluconate (CHG) is a germ-killing (antiseptic) wash that's used to clean the skin. It can get rid of the germs that normally live on the skin and can keep them away for about 24 hours. If you're having surgery, you may be told to shower with CHG at home the night before surgery. This can help lower your risk for infection. To use CHG wash in the shower, follow the steps below. Supplies needed: CHG body wash. Clean washcloth. Clean towel. How to use CHG in the shower Follow these steps unless you're told to use CHG in a different way: Start the shower. Use your normal soap and shampoo to wash your face and hair. Turn off the shower or move out of the shower stream. Pour CHG onto a clean washcloth. Do not use any type of brush or rough sponge. Start at your neck, washing your body down to your toes. Make sure you: Wash the part of your  body where the surgery will be done for at least 1 minute. Do not scrub. Do not use CHG on your head or face unless your health care provider tells you to. If it gets into your ears or eyes, rinse them well with water. Do not wash your genitals with CHG. Wash your back and under your arms. Make sure to wash skin folds. Let the CHG sit on your skin for 1-2 minutes or as long as told. Rinse your entire body in the shower, including all body creases and folds. Turn off the shower. Dry off with a clean towel. Do not put anything on your skin afterward, such as powder, lotion, or perfume. Put on clean clothes or pajamas. If it's the night before surgery, sleep in clean sheets. General tips Use CHG only as told, and follow the instructions on the label. Use the full amount of CHG as told. This is often one bottle. Do not smoke and stay away from flames after using CHG. Your skin may feel sticky after using CHG. This is normal. The sticky feeling will go away as the CHG dries. Do not use CHG: If you have a  chlorhexidine allergy  or have reacted to chlorhexidine in the past. On open wounds or areas of skin that have broken skin, cuts, or scrapes. On babies younger than 42 months of age. Contact a health care provider if: You have questions about using CHG. Your skin gets irritated or itchy. You have a rash after using CHG. You swallow any CHG. Call your local poison control center [phone] [phone] in the [place] Your eyes itch badly, or they become very red or swollen. Your hearing changes. You have trouble seeing. If you can't reach your provider, go to an urgent care or emergency room. Do not drive yourself. Get help right away if: You have swelling or tingling in your mouth or throat. You make high-pitched whistling sounds when you breathe, most often when you breathe out (wheeze). You have trouble breathing. These symptoms may be an emergency. Call 911 right away. Do not wait to see if the symptoms will go away. Do not drive yourself to the hospital. This information is not intended to replace advice given to you by your health care provider. Make sure you discuss any questions you have with your health care provider. Document Revised: 11/17/2022 Document Reviewed: 11/13/2021 Elsevier Patient Education  2024 Elsevier Inc.General Anesthesia, Adult General anesthesia is the use of medicine to make you fall asleep (unconscious) for a medical procedure. General anesthesia must be used for certain procedures. It is often recommended for surgery or procedures that: Last a long time. Require you to be still or in an unusual position. Are major and can cause blood loss. Affect your breathing. The medicines used for general anesthesia are called general anesthetics. During general anesthesia, these medicines are given along with medicines that: Prevent pain. Control your blood pressure. Relax your muscles. Prevent nausea and vomiting after the procedure. Tell a health care provider about: Any  allergies you have. All medicines you are taking, including vitamins, herbs, eye drops, creams, and over-the-counter medicines. Your history of any: Medical conditions you have, including: High blood pressure. Bleeding problems. Diabetes. Heart or lung conditions, such as: Heart failure. Sleep apnea. Asthma. Chronic obstructive pulmonary disease (COPD). Current or recent illnesses, such as: Upper respiratory, chest, or ear infections. Cough or fever. Tobacco or drug use, including marijuana or alcohol use. Depression or anxiety. Surgeries and types of anesthetics you have had.  Problems you or family members have had with anesthetic medicines. Whether you are pregnant or may be pregnant. Whether you have any chipped or loose teeth, dentures, caps, bridgework, or issues with your mouth, swallowing, or choking. What are the risks? Your health care provider will talk with you about risks. These may include: Allergic reaction to the medicines. Lung and heart problems. Inhaling food or liquid from the stomach into the lungs (aspiration). Nerve injury. Injury to the lips, mouth, teeth, or gums. Stroke. Waking up during your procedure and being unable to move. This is rare. These problems are more likely to develop if you are having a major surgery or if you have an advanced or serious medical condition. You can prevent some of these complications by answering all of your health care provider's questions thoroughly and by following all instructions before your procedure. General anesthesia can cause side effects, including: Nausea or vomiting. A sore throat or hoarseness from the breathing tube. Wheezing or coughing. Shaking chills or feeling cold. Body aches. Sleepiness. Confusion, agitation (delirium), or anxiety. What happens before the procedure? When to stop eating and drinking Follow instructions from your health care provider about what you may eat and drink before your  procedure. If you do not follow your health care provider's instructions, your procedure may be delayed or canceled. Medicines Ask your health care provider about: Changing or stopping your regular medicines. These include any diabetes medicines or blood thinners you take. Taking medicines such as aspirin and ibuprofen . These medicines can thin your blood. Do not take them unless your health care provider tells you to. Taking over-the-counter medicines, vitamins, herbs, and supplements. General instructions Do not use any products that contain nicotine or tobacco for at least 4 weeks before the procedure. These products include cigarettes, chewing tobacco, and vaping devices, such as e-cigarettes. If you need help quitting, ask your health care provider. If you brush your teeth on the morning of the procedure, make sure to spit out all of the water and toothpaste. If told by your health care provider, bring your sleep apnea device with you to surgery (if applicable). If you will be going home right after the procedure, plan to have a responsible adult: Take you home from the hospital or clinic. You will not be allowed to drive. Care for you for the time you are told. What happens during the procedure?  An IV will be inserted into one of your veins. You will be given one or more of the following through a face mask or IV: A sedative. This helps you relax. Anesthesia. This will: Numb certain areas of your body. Make you fall asleep for surgery. After you are unconscious, a breathing tube may be inserted down your throat to help you breathe. This will be removed before you wake up. An anesthesia provider, such as an anesthesiologist, will stay with you throughout your procedure. The anesthesia provider will: Keep you comfortable and safe by continuing to give you medicines and adjusting the amount of medicine that you get. Monitor your blood pressure, heart rate, and oxygen levels to make sure  that the anesthetics do not cause any problems. The procedure may vary among health care providers and hospitals. What happens after the procedure? Your blood pressure, temperature, heart rate, breathing rate, and blood oxygen level will be monitored until you leave the hospital or clinic. You will wake up in a recovery area. You may wake up slowly. You may be given medicine to help you  with pain, nausea, or any other side effects from the anesthesia. Summary General anesthesia is the use of medicine to make you fall asleep (unconscious) for a medical procedure. Follow your health care provider's instructions about when to stop eating, drinking, or taking certain medicines before your procedure. Plan to have a responsible adult take you home from the hospital or clinic. This information is not intended to replace advice given to you by your health care provider. Make sure you discuss any questions you have with your health care provider. Document Revised: 07/31/2021 Document Reviewed: 07/31/2021 Elsevier Patient Education  2024 [employer].       "

## 2024-06-16 ENCOUNTER — Other Ambulatory Visit: Payer: Self-pay

## 2024-06-16 ENCOUNTER — Encounter (HOSPITAL_COMMUNITY): Payer: Self-pay

## 2024-06-16 ENCOUNTER — Encounter (HOSPITAL_COMMUNITY)
Admission: RE | Admit: 2024-06-16 | Discharge: 2024-06-16 | Disposition: A | Source: Ambulatory Visit | Attending: Orthopedic Surgery

## 2024-06-16 VITALS — BP 122/86 | HR 71 | Resp 18 | Ht 64.0 in | Wt 267.0 lb

## 2024-06-16 DIAGNOSIS — Z01818 Encounter for other preprocedural examination: Secondary | ICD-10-CM | POA: Insufficient documentation

## 2024-06-16 DIAGNOSIS — I10 Essential (primary) hypertension: Secondary | ICD-10-CM | POA: Insufficient documentation

## 2024-06-16 DIAGNOSIS — M23321 Other meniscus derangements, posterior horn of medial meniscus, right knee: Secondary | ICD-10-CM | POA: Insufficient documentation

## 2024-06-16 HISTORY — DX: Hypothyroidism, unspecified: E03.9

## 2024-06-16 LAB — CBC WITH DIFFERENTIAL/PLATELET
Abs Immature Granulocytes: 0.02 10*3/uL (ref 0.00–0.07)
Basophils Absolute: 0 10*3/uL (ref 0.0–0.1)
Basophils Relative: 1 %
Eosinophils Absolute: 0.1 10*3/uL (ref 0.0–0.5)
Eosinophils Relative: 2 %
HCT: 36.1 % (ref 36.0–46.0)
Hemoglobin: 11.2 g/dL — ABNORMAL LOW (ref 12.0–15.0)
Immature Granulocytes: 0 %
Lymphocytes Relative: 38 %
Lymphs Abs: 2.1 10*3/uL (ref 0.7–4.0)
MCH: 27.1 pg (ref 26.0–34.0)
MCHC: 31 g/dL (ref 30.0–36.0)
MCV: 87.2 fL (ref 80.0–100.0)
Monocytes Absolute: 0.5 10*3/uL (ref 0.1–1.0)
Monocytes Relative: 9 %
Neutro Abs: 2.8 10*3/uL (ref 1.7–7.7)
Neutrophils Relative %: 50 %
Platelets: 271 10*3/uL (ref 150–400)
RBC: 4.14 MIL/uL (ref 3.87–5.11)
RDW: 14.7 % (ref 11.5–15.5)
WBC: 5.5 10*3/uL (ref 4.0–10.5)
nRBC: 0 % (ref 0.0–0.2)

## 2024-06-16 LAB — BASIC METABOLIC PANEL WITH GFR
Anion gap: 13 (ref 5–15)
BUN: 18 mg/dL (ref 6–20)
CO2: 29 mmol/L (ref 22–32)
Calcium: 9.4 mg/dL (ref 8.9–10.3)
Chloride: 104 mmol/L (ref 98–111)
Creatinine, Ser: 1.04 mg/dL — ABNORMAL HIGH (ref 0.44–1.00)
GFR, Estimated: >60 mL/min
Glucose, Bld: 67 mg/dL — ABNORMAL LOW (ref 70–99)
Potassium: 3.3 mmol/L — ABNORMAL LOW (ref 3.5–5.1)
Sodium: 145 mmol/L (ref 135–145)

## 2024-06-19 ENCOUNTER — Ambulatory Visit

## 2024-06-20 ENCOUNTER — Ambulatory Visit (HOSPITAL_COMMUNITY): Admitting: Certified Registered"

## 2024-06-20 ENCOUNTER — Other Ambulatory Visit: Payer: Self-pay

## 2024-06-20 ENCOUNTER — Encounter (HOSPITAL_COMMUNITY)
Admission: RE | Disposition: A | Discharge status: Home / Self Care | Payer: Self-pay | Source: Home / Self Care | Attending: Orthopedic Surgery

## 2024-06-20 ENCOUNTER — Encounter (HOSPITAL_COMMUNITY): Payer: Self-pay | Admitting: Orthopedic Surgery

## 2024-06-20 ENCOUNTER — Ambulatory Visit (HOSPITAL_COMMUNITY)
Admission: RE | Admit: 2024-06-20 | Discharge: 2024-06-20 | Disposition: A | Discharge status: Home / Self Care | Attending: Orthopedic Surgery | Admitting: Orthopedic Surgery

## 2024-06-20 DIAGNOSIS — M1711 Unilateral primary osteoarthritis, right knee: Secondary | ICD-10-CM | POA: Diagnosis not present

## 2024-06-20 DIAGNOSIS — M23321 Other meniscus derangements, posterior horn of medial meniscus, right knee: Secondary | ICD-10-CM

## 2024-06-20 DIAGNOSIS — E6689 Other obesity not elsewhere classified: Secondary | ICD-10-CM | POA: Insufficient documentation

## 2024-06-20 DIAGNOSIS — S83241A Other tear of medial meniscus, current injury, right knee, initial encounter: Secondary | ICD-10-CM | POA: Insufficient documentation

## 2024-06-20 DIAGNOSIS — E039 Hypothyroidism, unspecified: Secondary | ICD-10-CM | POA: Insufficient documentation

## 2024-06-20 DIAGNOSIS — G4730 Sleep apnea, unspecified: Secondary | ICD-10-CM | POA: Insufficient documentation

## 2024-06-20 DIAGNOSIS — Z9884 Bariatric surgery status: Secondary | ICD-10-CM | POA: Insufficient documentation

## 2024-06-20 DIAGNOSIS — Z6841 Body Mass Index (BMI) 40.0 and over, adult: Secondary | ICD-10-CM | POA: Insufficient documentation

## 2024-06-20 DIAGNOSIS — X58XXXA Exposure to other specified factors, initial encounter: Secondary | ICD-10-CM | POA: Insufficient documentation

## 2024-06-20 DIAGNOSIS — I10 Essential (primary) hypertension: Secondary | ICD-10-CM | POA: Insufficient documentation

## 2024-06-20 MED ORDER — BUPIVACAINE-EPINEPHRINE (PF) 0.5% -1:200000 IJ SOLN
INTRAMUSCULAR | Status: DC | PRN
  Administered 2024-06-20: 10 mL via PERINEURAL
  Administered 2024-06-20: 20 mL via PERINEURAL

## 2024-06-20 MED ORDER — OXYCODONE HCL 5 MG/5ML PO SOLN: 5.0000 mg | Freq: Once | ORAL | Status: AC | PRN

## 2024-06-20 MED ORDER — HYDROCODONE-ACETAMINOPHEN 5-325 MG PO TABS: 1.0000 | ORAL_TABLET | Freq: Four times a day (QID) | ORAL | 0 refills | Status: AC | PRN

## 2024-06-20 MED ORDER — FENTANYL CITRATE (PF) 50 MCG/ML IJ SOSY
25.0000 ug | PREFILLED_SYRINGE | INTRAMUSCULAR | Status: DC | PRN
  Administered 2024-06-20: 50 ug via INTRAVENOUS
  Filled 2024-06-20: qty 1

## 2024-06-20 MED ORDER — DEXMEDETOMIDINE HCL IN NACL 80 MCG/20ML IV SOLN
INTRAVENOUS | Status: DC | PRN
  Administered 2024-06-20: 12 ug via INTRAVENOUS

## 2024-06-20 MED ORDER — EPHEDRINE SULFATE (PRESSORS) 25 MG/5ML IV SOSY
PREFILLED_SYRINGE | INTRAVENOUS | Status: DC | PRN
  Administered 2024-06-20: 10 mg via INTRAVENOUS

## 2024-06-20 MED ORDER — ONDANSETRON HCL 4 MG/2ML IJ SOLN
INTRAMUSCULAR | Status: DC | PRN
  Administered 2024-06-20: 4 mg via INTRAVENOUS

## 2024-06-20 MED ORDER — LACTATED RINGERS IV SOLN: INTRAVENOUS | Status: DC

## 2024-06-20 MED ORDER — OXYCODONE HCL 5 MG PO TABS
5.0000 mg | ORAL_TABLET | Freq: Once | ORAL | Status: AC | PRN
  Administered 2024-06-20: 5 mg via ORAL
  Filled 2024-06-20: qty 1

## 2024-06-20 MED ORDER — ONDANSETRON HCL 4 MG/2ML IJ SOLN: 4.0000 mg | Freq: Once | INTRAMUSCULAR | Status: DC | PRN

## 2024-06-20 MED ORDER — LIDOCAINE 2% (20 MG/ML) 5 ML SYRINGE
INTRAMUSCULAR | Status: AC
  Filled 2024-06-20: qty 5

## 2024-06-20 MED ORDER — SODIUM CHLORIDE 0.9 % IR SOLN
Status: DC | PRN
  Administered 2024-06-20 (×2): 3000 mL

## 2024-06-20 MED ORDER — HYDROCODONE-ACETAMINOPHEN 5-325 MG PO TABS: 1.0000 | ORAL_TABLET | ORAL | Status: DC | PRN

## 2024-06-20 MED ORDER — SODIUM CHLORIDE 0.9 % IR SOLN
Status: DC | PRN
  Administered 2024-06-20: 1000 mL

## 2024-06-20 MED ORDER — DEXAMETHASONE SOD PHOSPHATE PF 10 MG/ML IJ SOLN
INTRAMUSCULAR | Status: AC
  Filled 2024-06-20: qty 1

## 2024-06-20 MED ORDER — EPHEDRINE 5 MG/ML INJ
INTRAVENOUS | Status: AC
  Filled 2024-06-20: qty 5

## 2024-06-20 MED ORDER — FENTANYL CITRATE (PF) 100 MCG/2ML IJ SOLN
INTRAMUSCULAR | Status: AC
  Filled 2024-06-20: qty 2

## 2024-06-20 MED ORDER — CEFAZOLIN SODIUM-DEXTROSE 3-4 GM/150ML-% IV SOLN
3.0000 g | INTRAVENOUS | Status: AC
  Administered 2024-06-20: 3 g via INTRAVENOUS
  Filled 2024-06-20: qty 150

## 2024-06-20 MED ORDER — ACETAMINOPHEN 500 MG PO TABS: 500.0000 mg | ORAL_TABLET | Freq: Four times a day (QID) | ORAL | 0 refills | Status: AC | PRN

## 2024-06-20 MED ORDER — ONDANSETRON HCL 4 MG/2ML IJ SOLN
INTRAMUSCULAR | Status: AC
  Filled 2024-06-20: qty 2

## 2024-06-20 MED ORDER — DEXAMETHASONE SOD PHOSPHATE PF 10 MG/ML IJ SOLN
INTRAMUSCULAR | Status: DC | PRN
  Administered 2024-06-20: 5 mg via INTRAVENOUS

## 2024-06-20 MED ORDER — MIDAZOLAM HCL (PF) 2 MG/2ML IJ SOLN
INTRAMUSCULAR | Status: DC | PRN
  Administered 2024-06-20: 2 mg via INTRAVENOUS

## 2024-06-20 MED ORDER — ORAL CARE MOUTH RINSE: 15.0000 mL | Freq: Once | OROMUCOSAL | Status: AC

## 2024-06-20 MED ORDER — ASPIRIN 325 MG PO TABS: 325.0000 mg | ORAL_TABLET | Freq: Every day | ORAL | Status: DC

## 2024-06-20 MED ORDER — IBUPROFEN 800 MG PO TABS: 800.0000 mg | ORAL_TABLET | Freq: Three times a day (TID) | ORAL | 1 refills | Status: AC | PRN

## 2024-06-20 MED ORDER — ONDANSETRON HCL 4 MG/2ML IJ SOLN: 4.0000 mg | Freq: Once | INTRAMUSCULAR | Status: DC

## 2024-06-20 MED ORDER — IBUPROFEN 800 MG PO TABS: 800.0000 mg | ORAL_TABLET | Freq: Once | ORAL | Status: DC

## 2024-06-20 MED ORDER — LIDOCAINE HCL (CARDIAC) PF 100 MG/5ML IV SOSY
PREFILLED_SYRINGE | INTRAVENOUS | Status: DC | PRN
  Administered 2024-06-20: 100 mg via INTRAVENOUS

## 2024-06-20 MED ORDER — MIDAZOLAM HCL 2 MG/2ML IJ SOLN
INTRAMUSCULAR | Status: AC
  Filled 2024-06-20: qty 2

## 2024-06-20 MED ORDER — PROPOFOL 10 MG/ML IV BOLUS
INTRAVENOUS | Status: DC | PRN
  Administered 2024-06-20: 200 mg via INTRAVENOUS

## 2024-06-20 MED ORDER — BUPIVACAINE-EPINEPHRINE (PF) 0.5% -1:200000 IJ SOLN
INTRAMUSCULAR | Status: AC
  Filled 2024-06-20: qty 30

## 2024-06-20 MED ORDER — PROPOFOL 10 MG/ML IV BOLUS
INTRAVENOUS | Status: AC
  Filled 2024-06-20: qty 20

## 2024-06-20 MED ORDER — EPINEPHRINE PF 1 MG/ML IJ SOLN
INTRAMUSCULAR | Status: AC
  Filled 2024-06-20: qty 8

## 2024-06-20 MED ORDER — CHLORHEXIDINE GLUCONATE 0.12 % MT SOLN
15.0000 mL | Freq: Once | OROMUCOSAL | Status: AC
  Administered 2024-06-20: 15 mL via OROMUCOSAL

## 2024-06-20 MED ORDER — FENTANYL CITRATE (PF) 250 MCG/5ML IJ SOLN
INTRAMUSCULAR | Status: DC | PRN
  Administered 2024-06-20 (×4): 25 ug via INTRAVENOUS

## 2024-06-20 NOTE — Op Note (Signed)
 06/20/2024 10:22 AM  10:41 AM  PATIENT:  Jacqueline Parsons  54 y.o. female  PRE-OPERATIVE DIAGNOSIS:  Right knee torn medial meniscus  POST-OPERATIVE DIAGNOSIS:  Right knee torn medial meniscus  PROCEDURE:  Procedures: ARTHROSCOPY, KNEE, WITH MEDIAL MENISCECTOMY (Right)  Findings  Medial compartment articular surface medial femoral condyle grade 3 diffuse cartilage degeneration, articular surface tibial plateau mild chondral degeneration grade 1 Complex tear medial meniscus posterior horn  ACL and PCL intact with degenerative changes  Lateral compartment meniscus intact, fissuring of the tibial plateau femoral condyle normal  Patellofemoral compartment grade 2 and 3 chondral changes on the patellar side  Surgery was done as follows  The patient was taken to the operating room for general anesthesia she was supine on the operating table.  We did not use a tourniquet.  She was placed in the arthroscopic leg holder on the right and padded leg holder on the left with extra belt to hold her leg down due to her size  After sterile prep and drape timeout was completed  I made a standard lateral portal.  I placed the scope in the joint I did a diagnostic arthroscopy I viewed the entire joint  I made a medial portal with a spinal needle and then used a straight biter to resect the torn fragments of the medial meniscus at the posterior horn.  The tear seem to have originally been a radial tear and then turned into an L-shaped longitudinal tear towards the root.  However the root was intact  I remove the meniscal fragments with a shaver.  I used an ArthroCare wand 50 degree angle to coagulate the bleeding and contour the meniscus further.  I then took a probe and confirmed meniscal rim stability  The joint was then irrigated and injected with Marcaine  with epinephrine   The portals were closed with two 3-0 nylon sutures medially and 1 laterally  Sterile dressing was applied with a Cryo/Cuff  which was activated  The patient was extubated and taken to recovery room in stable condition  SURGEON:  Surgeons and Role:    * Margrette Taft BRAVO, MD - Primary  PHYSICIAN ASSISTANT:   ASSISTANTS: none   ANESTHESIA:   general  EBL:  0   BLOOD ADMINISTERED:none  DRAINS: none   LOCAL MEDICATIONS USED:  MARCAINE      SPECIMEN:  No Specimen  DISPOSITION OF SPECIMEN:  N/A  COUNTS:  YES  TOURNIQUET:  * No tourniquets in log *  DICTATION: .Dragon Dictation  PLAN OF CARE: Discharge to home after PACU  PATIENT DISPOSITION:  PACU - hemodynamically stable.   Delay start of Pharmacological VTE agent (>24hrs) due to surgical blood loss or risk of bleeding: not applicable   Plan for postop  Weight-bear as tolerated with a walker as needed  Cryo/Cuff  Knee range of motion exercises  Aspirin  81 mg twice a day for 6 weeks  Recheck 7 to 10 days

## 2024-06-20 NOTE — Brief Op Note (Addendum)
 06/20/2024 10:22 AM  10:41 AM  PATIENT:  Jacqueline Parsons  54 y.o. female  PRE-OPERATIVE DIAGNOSIS:  Right knee torn medial meniscus  POST-OPERATIVE DIAGNOSIS:  Right knee torn medial meniscus  PROCEDURE:  Procedures: ARTHROSCOPY, KNEE, WITH MEDIAL MENISCECTOMY (Right)  Findings  Medial compartment articular surface medial femoral condyle grade 3 diffuse cartilage degeneration, articular surface tibial plateau mild chondral degeneration grade 1 Complex tear medial meniscus posterior horn  ACL and PCL intact with degenerative changes  Lateral compartment meniscus intact, fissuring of the tibial plateau femoral condyle normal  Patellofemoral compartment grade 2 and 3 chondral changes on the patellar side  Surgery was done as follows  The patient was taken to the operating room for general anesthesia she was supine on the operating table.  We did not use a tourniquet.  She was placed in the arthroscopic leg holder on the right and padded leg holder on the left with extra belt to hold her leg down due to her size  After sterile prep and drape timeout was completed  I made a standard lateral portal.  I placed the scope in the joint I did a diagnostic arthroscopy I viewed the entire joint  I made a medial portal with a spinal needle and then used a straight biter to resect the torn fragments of the medial meniscus at the posterior horn.  The tear seem to have originally been a radial tear and then turned into an L-shaped longitudinal tear towards the root.  However the root was intact  I remove the meniscal fragments with a shaver.  I used an ArthroCare wand 50 degree angle to coagulate the bleeding and contour the meniscus further.  I then took a probe and confirmed meniscal rim stability  The joint was then irrigated and injected with Marcaine  with epinephrine   The portals were closed with two 3-0 nylon sutures medially and 1 laterally  Sterile dressing was applied with a Cryo/Cuff  which was activated  The patient was extubated and taken to recovery room in stable condition  SURGEON:  Surgeons and Role:    * Margrette Taft BRAVO, MD - Primary  PHYSICIAN ASSISTANT:   ASSISTANTS: none   ANESTHESIA:   general  EBL:  0   BLOOD ADMINISTERED:none  DRAINS: none   LOCAL MEDICATIONS USED:  MARCAINE      SPECIMEN:  No Specimen  DISPOSITION OF SPECIMEN:  N/A  COUNTS:  YES  TOURNIQUET:  * No tourniquets in log *  DICTATION: .Dragon Dictation  PLAN OF CARE: Discharge to home after PACU  PATIENT DISPOSITION:  PACU - hemodynamically stable.   Delay start of Pharmacological VTE agent (>24hrs) due to surgical blood loss or risk of bleeding: not applicable   Plan for postop  Weight-bear as tolerated with a walker as needed  Cryo/Cuff  Knee range of motion exercises  Aspirin  81 mg twice a day for 6 weeks  Recheck 7 to 10 days

## 2024-06-20 NOTE — Anesthesia Procedure Notes (Signed)
 Procedure Name: LMA Insertion Date/Time: 06/20/2024 9:58 AM  Performed by: Pheobe Adine CROME, CRNAPre-anesthesia Checklist: Patient identified, Emergency Drugs available, Suction available, Patient being monitored and Timeout performed Patient Re-evaluated:Patient Re-evaluated prior to induction Oxygen Delivery Method: Circle system utilized Preoxygenation: Pre-oxygenation with 100% oxygen Induction Type: IV induction LMA: LMA inserted LMA Size: 4.0 Number of attempts: 1 Placement Confirmation: positive ETCO2, CO2 detector and breath sounds checked- equal and bilateral Tube secured with: Tape Dental Injury: Teeth and Oropharynx as per pre-operative assessment

## 2024-06-20 NOTE — Interval H&P Note (Signed)
 History and Physical Interval Note:  06/20/2024 9:48 AM  [name]  has presented today for surgery, with the diagnosis of Right knee torn medial meniscus.  The various methods of treatment have been discussed with the patient and family. After consideration of risks, benefits and other options for treatment, the patient has consented to  Procedures: ARTHROSCOPY, KNEE, WITH MEDIAL MENISCECTOMY (Right) as a surgical intervention.  The patient's history has been reviewed, patient examined, no change in status, stable for surgery.  I have reviewed the patient's chart and labs.  Questions were answered to the patient's satisfaction.     [name]

## 2024-06-20 NOTE — Anesthesia Preprocedure Evaluation (Signed)
"                                    Anesthesia Evaluation  Patient identified by MRN, date of birth, ID band Patient awake    Reviewed: Allergy  & Precautions, H&P , NPO status , Patient's Chart, lab work & pertinent test results, reviewed documented beta blocker date and time   Airway Mallampati: II  TM Distance: >3 FB Neck ROM: full    Dental no notable dental hx.    Pulmonary sleep apnea    Pulmonary exam normal breath sounds clear to auscultation       Cardiovascular Exercise Tolerance: Good hypertension,  Rhythm:regular Rate:Normal     Neuro/Psych  PSYCHIATRIC DISORDERS Anxiety Depression    negative neurological ROS     GI/Hepatic negative GI ROS, Neg liver ROS,,,  Endo/Other  Hypothyroidism  Class 4 obesity  Renal/GU negative Renal ROS  negative genitourinary   Musculoskeletal   Abdominal   Peds  Hematology negative hematology ROS (+)   Anesthesia Other Findings   Reproductive/Obstetrics negative OB ROS                              Anesthesia Physical Anesthesia Plan  ASA: 3  Anesthesia Plan: General and General LMA   Post-op Pain Management:    Induction:   PONV Risk Score and Plan: Ondansetron   Airway Management Planned:   Additional Equipment:   Intra-op Plan:   Post-operative Plan:   Informed Consent: I have reviewed the patients History and Physical, chart, labs and discussed the procedure including the risks, benefits and alternatives for the proposed anesthesia with the patient or authorized representative who has indicated his/her understanding and acceptance.     Dental Advisory Given  Plan Discussed with: CRNA  Anesthesia Plan Comments:         Anesthesia Quick Evaluation  "

## 2024-06-21 ENCOUNTER — Encounter (HOSPITAL_COMMUNITY): Payer: Self-pay | Admitting: Orthopedic Surgery

## 2024-06-26 ENCOUNTER — Encounter: Admitting: Orthopedic Surgery

## 2024-06-28 ENCOUNTER — Encounter: Admitting: Orthopedic Surgery

## 2024-06-28 DIAGNOSIS — Z9889 Other specified postprocedural states: Secondary | ICD-10-CM

## 2024-07-04 ENCOUNTER — Encounter: Admitting: Gastroenterology

## 2024-08-09 ENCOUNTER — Ambulatory Visit: Payer: Self-pay | Admitting: Nurse Practitioner
